# Patient Record
Sex: Male | Born: 1950 | Race: White | Hispanic: No | Smoking: Never smoker
Health system: Southern US, Community
[De-identification: ages and names within clinical notes are randomized; demographics above are authoritative.]

## PROBLEM LIST (undated history)

## (undated) DIAGNOSIS — J45909 Unspecified asthma, uncomplicated: Secondary | ICD-10-CM

## (undated) DIAGNOSIS — M1990 Unspecified osteoarthritis, unspecified site: Secondary | ICD-10-CM

## (undated) DIAGNOSIS — T7840XA Allergy, unspecified, initial encounter: Secondary | ICD-10-CM

## (undated) DIAGNOSIS — R972 Elevated prostate specific antigen [PSA]: Secondary | ICD-10-CM

## (undated) DIAGNOSIS — H269 Unspecified cataract: Secondary | ICD-10-CM

## (undated) HISTORY — DX: Allergy, unspecified, initial encounter: T78.40XA

## (undated) HISTORY — DX: Unspecified cataract: H26.9

## (undated) HISTORY — PX: COLONOSCOPY: SHX174

## (undated) HISTORY — PX: NASAL POLYP EXCISION: SHX2068

## (undated) HISTORY — PX: POLYPECTOMY: SHX149

## (undated) HISTORY — DX: Elevated prostate specific antigen (PSA): R97.20

## (undated) HISTORY — DX: Unspecified asthma, uncomplicated: J45.909

## (undated) HISTORY — DX: Unspecified osteoarthritis, unspecified site: M19.90

---

## 2007-11-19 HISTORY — PX: TOTAL HIP ARTHROPLASTY: SHX124

## 2008-08-29 ENCOUNTER — Inpatient Hospital Stay (HOSPITAL_COMMUNITY): Admission: RE | Admit: 2008-08-29 | Discharge: 2008-09-01 | Payer: Self-pay | Admitting: Orthopedic Surgery

## 2008-08-29 ENCOUNTER — Encounter (INDEPENDENT_AMBULATORY_CARE_PROVIDER_SITE_OTHER): Payer: Self-pay | Admitting: Orthopedic Surgery

## 2009-10-19 IMAGING — CR DG CHEST 2V
2 series · 2 of 2 positions shown · non-contrast
Comparison: None

CLINICAL DATA: Preop evaluation for right hip replacement.

CHEST - 2 VIEW

[w chest pa]
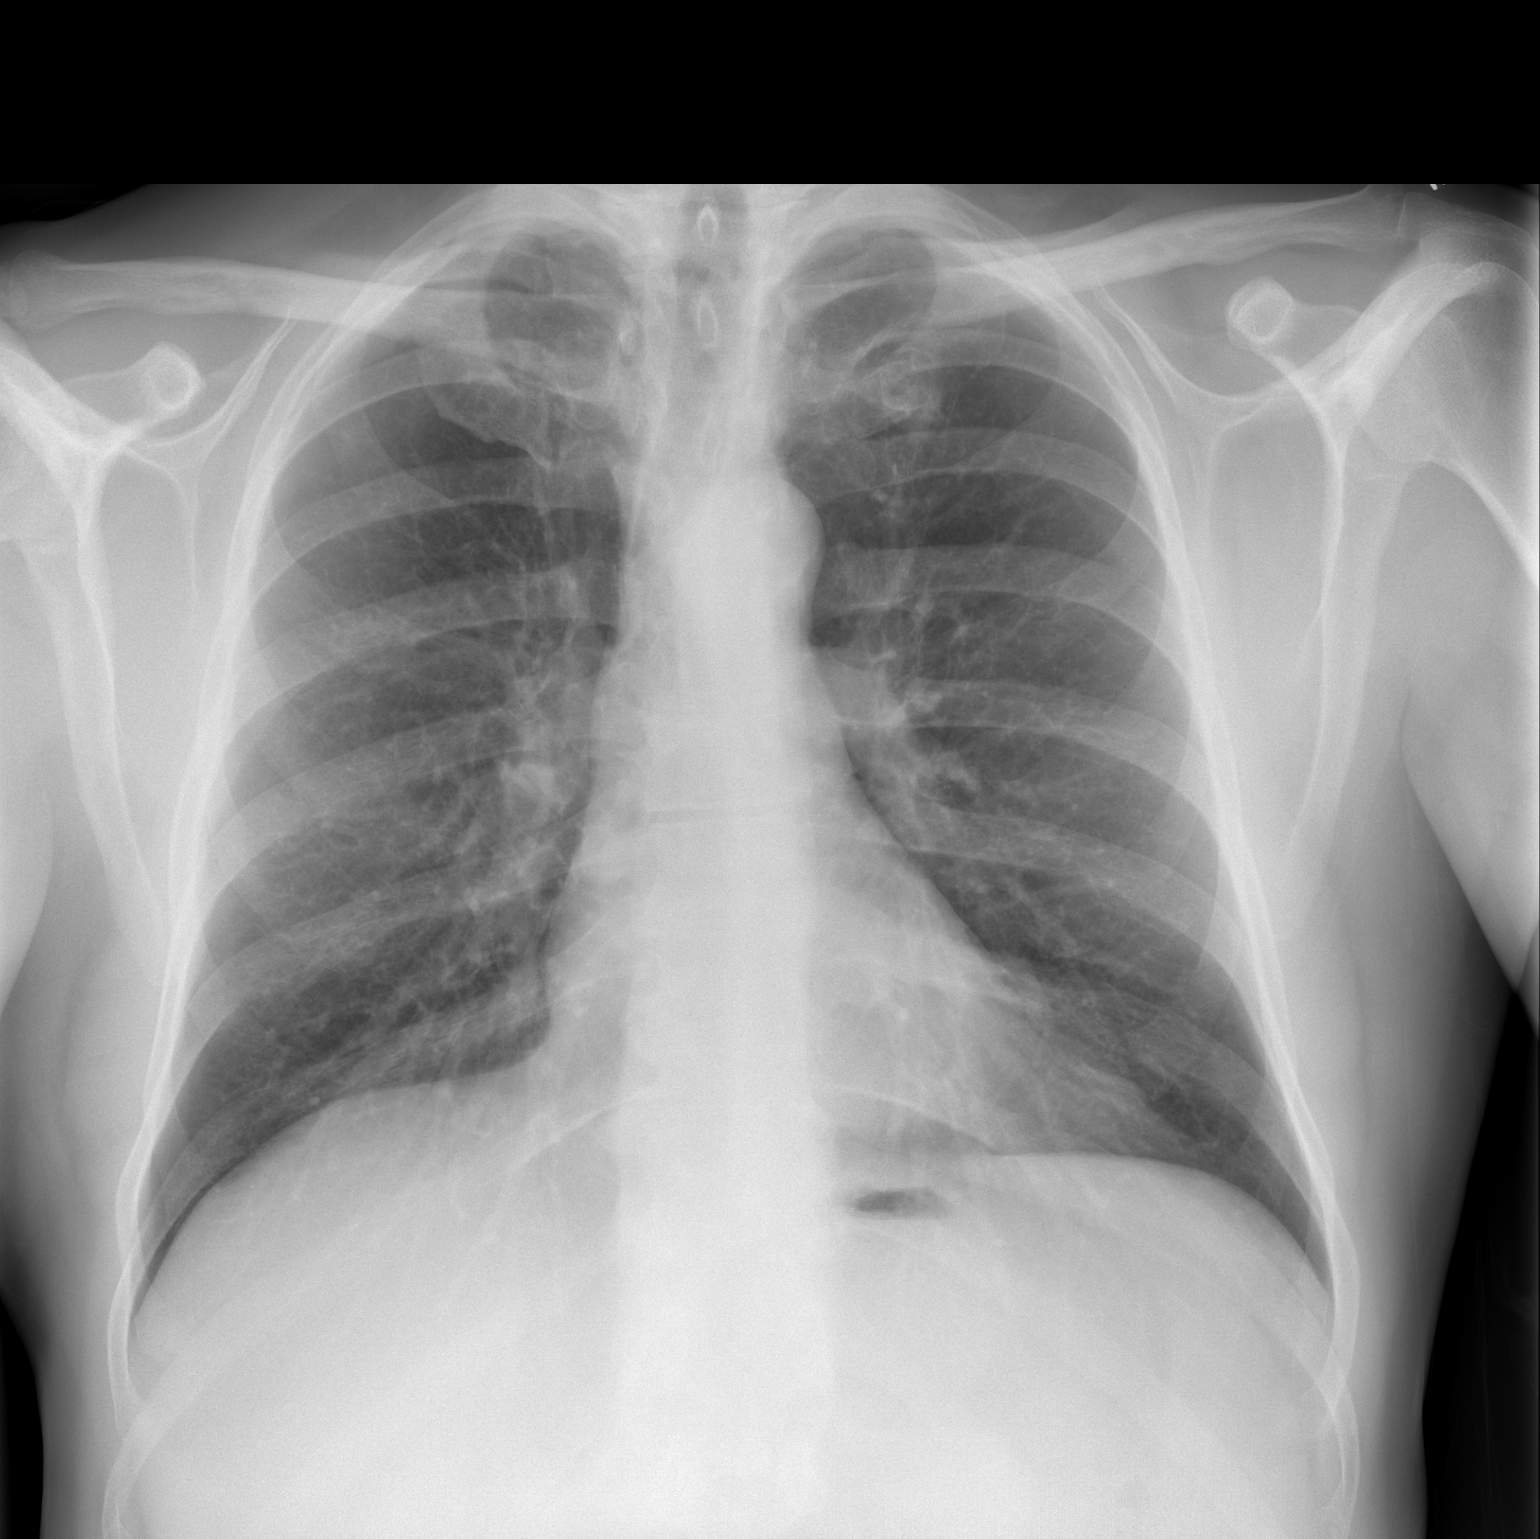

[w chest lat]
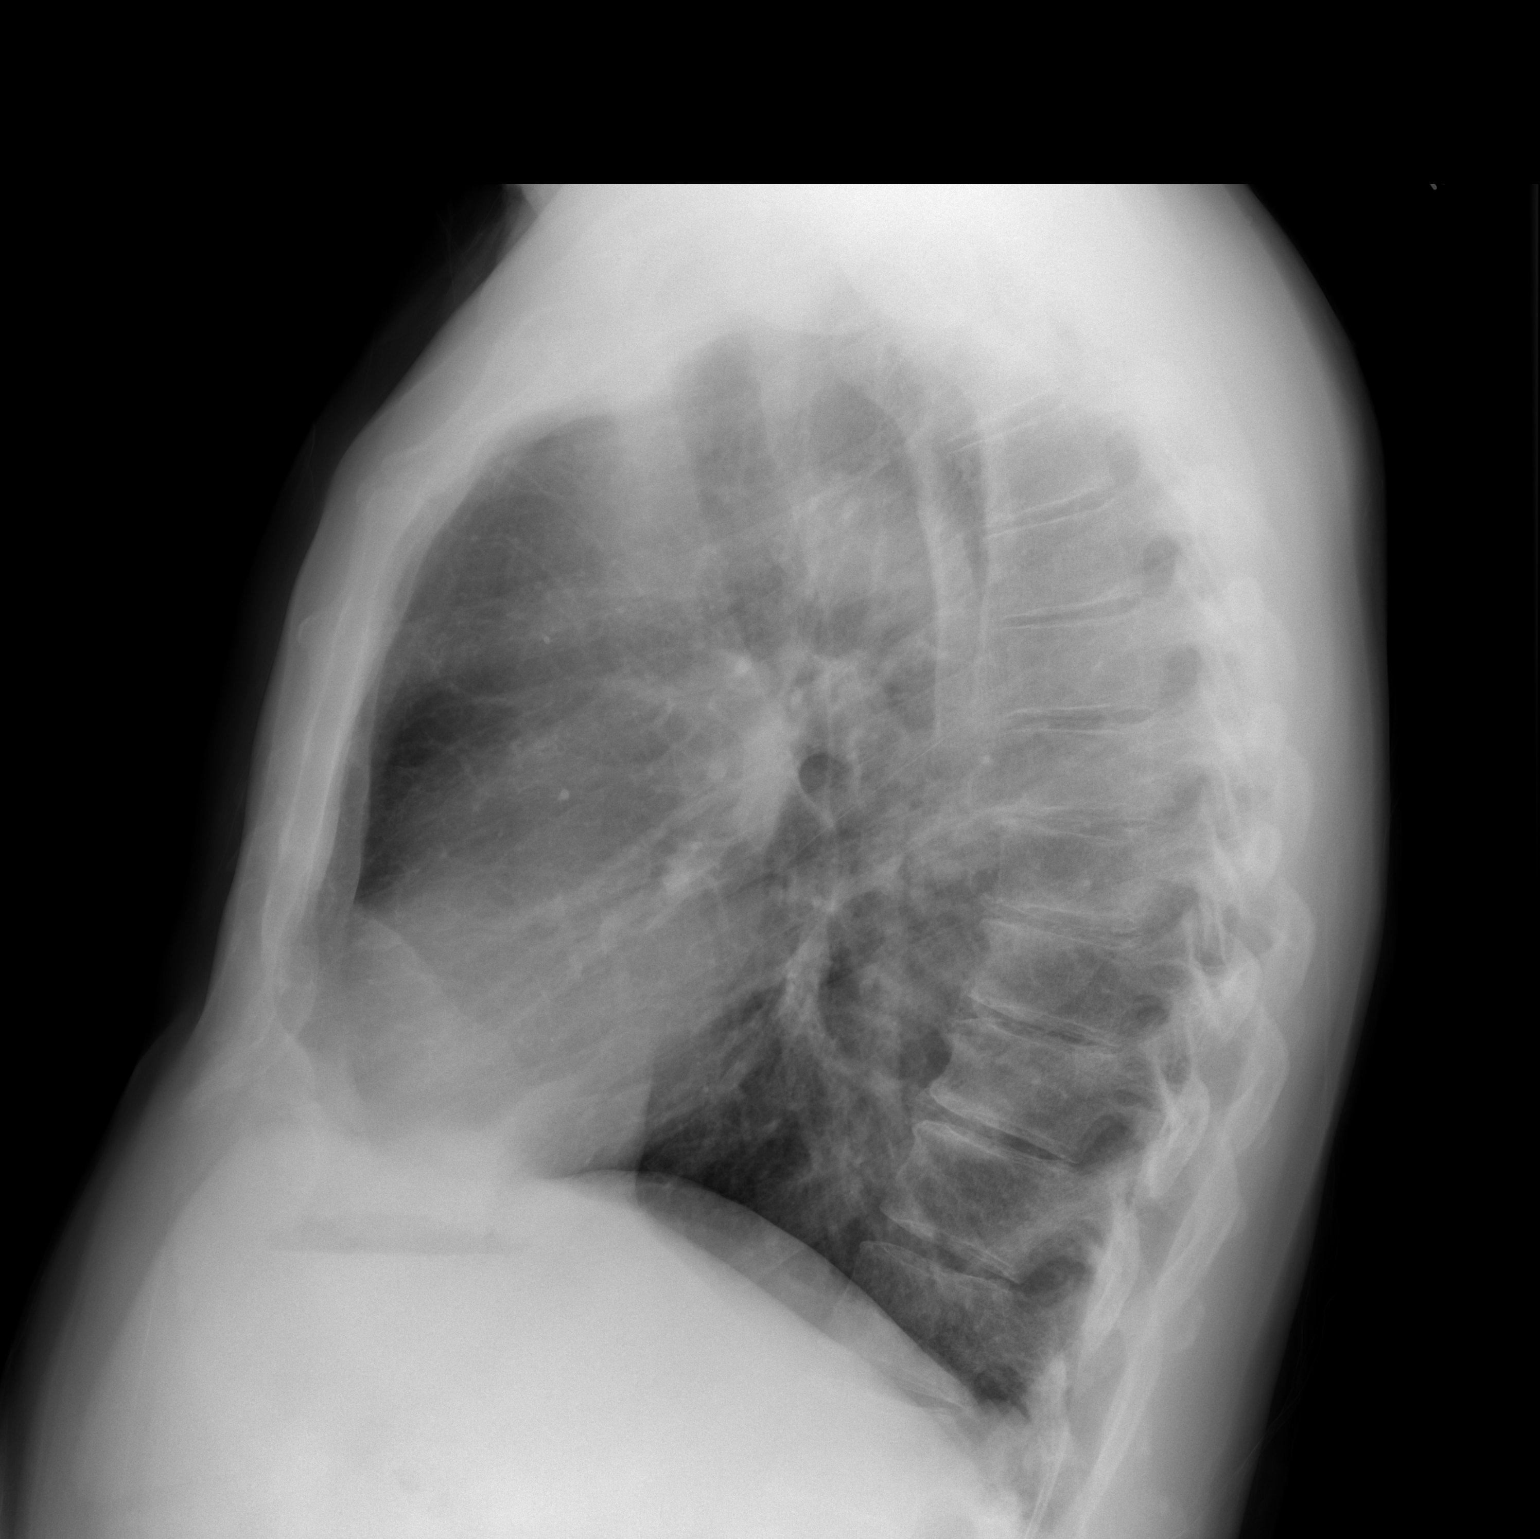

[2 of 2 positions shown; findings below may reference images not displayed]

FINDINGS: Mild hyperexpansion without focal consolidation, edema,
or pleural effusion. The cardiopericardial silhouette is within
normal limits for size. Imaged bony structures of the thorax are
intact.
IMPRESSION: Mild hyperinflation without acute cardiopulmonary findings.

## 2009-10-24 IMAGING — CR DG PORTABLE PELVIS
1 series · 1 of 1 positions shown · non-contrast
Comparison: Portable right hip obtained at the same time.

CLINICAL DATA: Right hip replacement for osteoarthritis.

PORTABLE PELVIS

[view not recorded]
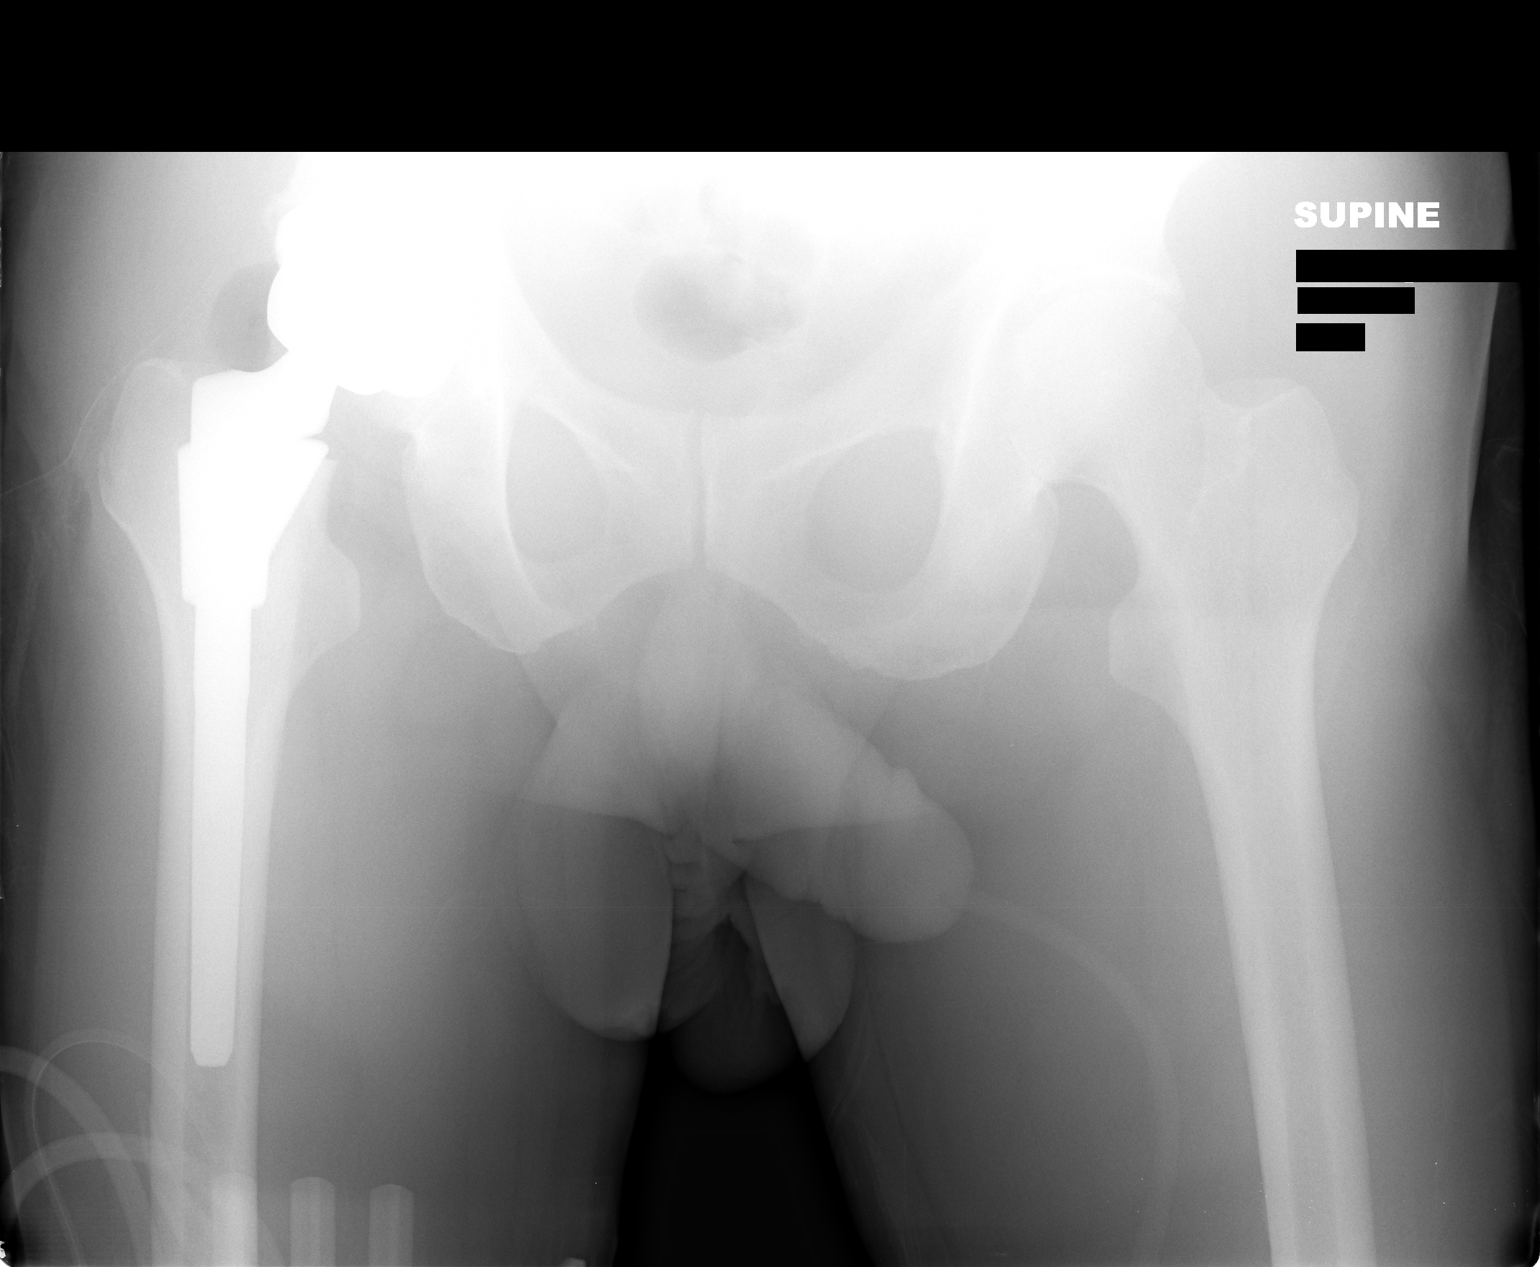

[1 of 1 positions shown; findings below may reference images not displayed]

FINDINGS: Right total hip prosthesis in satisfactory position and
alignment with no fracture or dislocation seen.  Normal appearing
left hip.
IMPRESSION: Satisfactory postoperative appearance of a right total hip
prosthesis.

## 2010-04-30 ENCOUNTER — Emergency Department: Payer: Self-pay | Admitting: Emergency Medicine

## 2010-05-02 ENCOUNTER — Emergency Department (HOSPITAL_COMMUNITY): Admission: EM | Admit: 2010-05-02 | Discharge: 2010-05-02 | Payer: Self-pay | Admitting: Emergency Medicine

## 2010-05-04 ENCOUNTER — Ambulatory Visit (HOSPITAL_COMMUNITY): Admission: RE | Admit: 2010-05-04 | Discharge: 2010-05-04 | Payer: Self-pay | Admitting: Urology

## 2011-02-03 LAB — BASIC METABOLIC PANEL
CO2: 27 mEq/L (ref 19–32)
Calcium: 9.2 mg/dL (ref 8.4–10.5)
Chloride: 104 mEq/L (ref 96–112)
GFR calc Af Amer: >60 mL/min (ref >60)
GFR calc non Af Amer: 51 mL/min — ABNORMAL LOW (ref >60)
Glucose, Bld: 99 mg/dL (ref 70–99)

## 2011-02-03 LAB — CBC
Hemoglobin: 14.9 g/dL (ref 13.0–17.0)
MCHC: 35.2 g/dL (ref 30.0–36.0)
RBC: 4.94 MIL/uL (ref 4.22–5.81)
RDW: 12.5 % (ref 11.5–15.5)
WBC: 11.8 10*3/uL — ABNORMAL HIGH (ref 4.0–10.5)

## 2011-02-03 LAB — DIFFERENTIAL
Basophils Absolute: 0.1 10*3/uL (ref 0.0–0.1)
Basophils Relative: 1 % (ref 0–1)
Eosinophils Absolute: 0.1 10*3/uL (ref 0.0–0.7)
Eosinophils Relative: 1 % (ref 0–5)
Lymphocytes Relative: 6 % — ABNORMAL LOW (ref 12–46)
Monocytes Absolute: 0.9 10*3/uL (ref 0.1–1.0)
Monocytes Relative: 7 % (ref 3–12)
Neutro Abs: 9.9 10*3/uL — ABNORMAL HIGH (ref 1.7–7.7)
Neutrophils Relative %: 85 % — ABNORMAL HIGH (ref 43–77)

## 2011-02-03 LAB — SURGICAL PCR SCREEN: Staphylococcus aureus: POSITIVE — AB

## 2011-04-02 NOTE — Discharge Summary (Signed)
NAME:  LENIN, KUHNLE NO.:  [id number]   MEDICAL RECORD NO.:  [id number]          PATIENT TYPE:  INP   LOCATION:  1620                         FACILITY:  [facility]   PHYSICIAN:  Ollen Gross, M.D.    DATE OF BIRTH:  [birth year]   DATE OF ADMISSION:  08/29/2008  DATE OF DISCHARGE:  09/01/2008                               DISCHARGE SUMMARY   ADMITTING DIAGNOSES:  1. Osteoarthritis right hip.  2. Degenerative disk disease lumbar spine.   DISCHARGE DIAGNOSES:  1. Osteoarthritis right hip status post right total replacement      arthroplasty.  2. Osteoarthritis right hip.  3. Degenerative disk disease lumbar spine.   PROCEDURE:  August 29, 2008 right total hip.  Surgeon Dr. Lequita Halt,  assistant Julien Girt PA-C.  Anesthesia general.   CONSULTS:  None.   BRIEF HISTORY:  Yohannes is a 60 year old male with severe end-stage  arthritis of the right hip, progressive worsening pain and dysfunction,  failed outpatient management now presents for total hip arthroplasty.   LABORATORY DATA:  Preop CBC showed hemoglobin of 16.2, hematocrit of  46.2, white cell count 6.7, platelets 184.  Chem panel slightly elevated  total bili of 1.3.  Remaining Chem panel all within normal limits.  PT/INR 13.1 and 1 with a PTT of 31.  Preop UA negative.  Serial CBCs  were followed throughout the hospital course.  Hemoglobin did drop down  to 11.9.  Last noted H&H was 11.7 and 33.  Serial protimes followed.  PT/INR 20 and 1.6.  Serial B-mets were followed.  Electrolytes remained  within normal limits  Pathology report:  Femoral head cyst evaluation, the articular cartilage  shows degenerative changes and there is a subchondral fibrosis with  associated cyst formation.  The findings are consistent with  degenerative arthritis.  Chest x-ray: August 24, 2008:  Mild hyperinflation without acute  cardiopulmonary findings.  Right hip films August 24, 2008:  Osteoarthritic changes right hip.  EKG  August 24, 2008:  Normal sinus rhythm, normal EKG, no old trace to  compare, confirmed by Dr. Nicki Guadalajara.   HOSPITAL COURSE:  The patient was admitted to [facility].  Tolerated procedure  well, later transferred to recovery and  the  orthopedic floor, started on PCA and p.o. analgesic pain control  following surgery.  Actually did pretty well on the evening of surgery,  on the morning of day 1, did sit up and dangle his feet on the evening  of surgery, starting to get up, short distance on day 1.  Hemovac drain  placed during surgery was pulled on day 1, given 24 hours postop IV  antibiotics.  Started on PCA, which was discontinued on the next day.  On postop day #1, had excellent urinary output.  He got up and walked  200 feet on the afternoon postop day #1, doing extremely well.  By day  2, dessing changed, incision looked good.  He was tolerating p.o. meds,  walking nearly 300 feet.  Continued to progress well and  was ready go  home by postop day #3.  DISCHARGE PLAN:  The patient discharged home on September 01, 2008.   DISCHARGE DIAGNOSES:  Please see above.   DISCHARGE MEDS:  1. Coumadin.  2. Percocet.  3. Robaxin.   DIET:  Regular diet.   ACTIVITIES:  Partial weightbearing 25-50% to the right lower extremity.  Home health PT and home health nursing.  Total hip protocol.  Hip  precautions.  May start showering but do not submerge incision under  water.  Follow-up 2 weeks.   DISPOSITION:  Home.   CONDITION ON DISCHARGE:  Improving.      Alexzandrew L. Perkins, P.A.C.      Ollen Gross, M.D.  Electronically Signed    ALP/MEDQ  D:  09/01/2008  T:  09/01/2008  Job:  [id number]   cc:   Ollen Gross, M.D.  Fax: [phone]

## 2011-04-02 NOTE — Op Note (Signed)
NAME:  Tom Mcknight, Tom Mcknight NO.:  [id number]   MEDICAL RECORD NO.:  [id number]          PATIENT TYPE:  INP   LOCATION:  0007                         FACILITY:  [facility]   PHYSICIAN:  Ollen Gross, M.D.    DATE OF BIRTH:  1951/09/17   DATE OF PROCEDURE:  08/29/2008  DATE OF DISCHARGE:                               OPERATIVE REPORT   PREOPERATIVE DIAGNOSIS:  Osteoarthritis right hip.   POSTOPERATIVE DIAGNOSIS:  Osteoarthritis right hip.   PROCEDURE:  Right total hip arthroplasty.   SURGEON:  Ollen Gross, M.D.   ASSISTANT:  Avel Peace PA-C   ANESTHESIA:  General.   ESTIMATED BLOOD LOSS:  700   DRAIN:  Hemovac times one.   COMPLICATIONS:  None.   CONDITION.:  Stable to the recovery room.   BRIEF OPERATIVE NOTE:  Tom Mcknight is a 60 year old male with end-stage  arthritis of the right hip with progressively worsening pain and  dysfunction.  He has failed nonoperative management abdomen presents for  total hip arthroplasty.   PROCEDURE IN DETAIL:  After the successful administration of general  anesthetic, the patient was placed in left lateral decubitus position  with the right side up and held with the hip positioner.  Right lower  extremity isolated from his perineum with plastic drapes and prepped and  draped in the usual sterile fashion.  Short posterolateral incision was  made with a 10 blade through subcutaneous tissue to the level of the  fascia lata which was incised in line with skin incision.  The sciatic  nerve was palpated and protected and short external rotators isolated  off the femur.  Capsulectomy is performed and the hip was dislocated.  The center of femoral head is marked and a trial prosthesis placed such  that the center of the trial head corresponds to center of his native  femoral head.  Osteotomy lines marked on the femoral neck and osteotomy  made with an oscillating saw.  The femoral head removed and the femur  was retracted anteriorly  to gain acetabular exposure.   Acetabular retractors were placed and labrum and osteophytes removed.  Reaming starts at 47 mm coursing increments of 2-55 mm and a 56-mm  pinnacle acetabular shell was placed in anatomic position and transfixed  with two dome screws.  The apex hole eliminator is placed and then the  permanent 40 mm neutral Ultramet liner is placed for metal-on-metal hip  replacement.   The femur was repaired canal finder irrigation.  Axial reaming is  performed 13.5 mm proximal reaming to 18.5 large sleeve machined to a  large.  After large trial sleeve is placed with 18 x 13 stem and 36 plus  8 neck 10 degrees beyond native anteversion.  The 40 plus 0 head is  placed and the hip is reduced with outstanding stability.  There is full  extension, full external rotation, 70 degrees of flexion, 40 degrees of  adduction and 90 degrees of internal rotation and 90 degrees of flexion  and 70 degrees of internal rotation.  By placing the right leg on top of  the  left it felt as though leg lengths were equal.  The hip was then  dislocated and all trials were removed.  The permanent 18.5 large sleeve  is placed and 18 x 13 stem with 36 plus 8 neck 10 degrees beyond native  anteversion.  A 40 plus 0 head is placed and the hip reduced with same  stability parameters.  The wound was copiously irrigated with saline  solution and short rotators reattached to the femur through drill holes.  Fascia lata closed over Hemovac drain with interrupted #1 Vicryl, subcu  closed with #1-0 and #2-0 Vicryl, and subcuticular running 4-0 Monocryl.  Incisions cleaned and dried and Steri-Strips and a bulky sterile  dressing applied.  The drain was hooked to suction and he was placed  into a knee immobilizer, awakened and transferred to recovery in stable  condition.      Ollen Gross, M.D.  Electronically Signed     FA/MEDQ  D:  08/29/2008  T:  08/29/2008  Job:  [id number]

## 2011-04-05 NOTE — H&P (Signed)
NAME:  Tom Mcknight, Tom Mcknight NO.:  [id number]   MEDICAL RECORD NO.:  [id number]          PATIENT TYPE:  INP   LOCATION:                               FACILITY:  [facility]   PHYSICIAN:  Ollen Gross, M.D.    DATE OF BIRTH:  1951-07-04   DATE OF ADMISSION:  08/29/2008  DATE OF DISCHARGE:                              HISTORY & PHYSICAL   CHIEF COMPLAINT:  Right hip pain.   HISTORY OF PRESENT ILLNESS:  The patient is a 60 year old gentleman with  right hip pain.  He has been evaluated and treated for arthritis of the  right hip since 2003.  He has had progressive worsening of right hip  pain.  He has noted loss of range of motion.  He has failed continued  conservative treatment.  The patient has elected to proceed with a right  total hip arthroplasty.   ALLERGIES:  SULFA MEDICATIONS.   CURRENT MEDICATIONS:  1. Aspirin 81 mg a day.  2. _____________200 mg once a day.   PRIMARY CARE PHYSICIAN:  Dr. Alonna Buckler.   NEUROLOGIST:  Dr. Trey Sailors.   UROLOGIST:  Dr. Gaynelle Arabian.   PRESENT MEDICAL HISTORY:  Degenerative disc disease of the lumbar spine.   REVIEW OF SYSTEMS:  Is negative for any NEUROLOGICAL issues, PULMONARY  issues.  He outgrew asthma, last attack was at the age of 39.  He denies  any CARDIOVASCULAR issues.  No GI or GU issues.  No ENDOCRINE or  HEMATOLOGIC issues.   PAST SURGICAL HISTORY:  Positive for nasal polyps in 1996 and 1998  without any complications.   FAMILY MEDICAL HISTORY:  Father is deceased at age 22, complications  related to brain tumor surgery.  Mother is age 18 with dementia.   SOCIAL HISTORY:  The patient is married.  He is a Programmer, multimedia.  He has never smoked.  No alcohol or drugs.  He has two grown  children.  Lives in the home with one story.   PHYSICAL EXAMINATION:  VITAL SIGNS:  Height is 6 feet, 1 inch.  Weight  is 200 pounds.  Blood pressure is 134/88, pulse is 70 and regular,  respirations 12.  The patient is  afebrile.  GENERAL:  This is a healthy-appearing well-developed, physically fit-  appearing gentleman who is conscious, alert and appropriate.  He appears  to be a good historian.  He walks with a fairly easy balanced gait.  HEENT:  Head was normocephalic.  Pupils equal, round, reactive.  Gross  hearing is intact.  NECK:  Supple.  No palpable lymphadenopathy.  Good range of motion.  CHEST:  Lung sounds are clear and equal bilaterally.  No wheezes, rales  or rhonchi.  HEART:  Regular rate and rhythm with no murmurs, rubs or gallops.  ABDOMEN:  Positive bowel sounds, soft, nontender.  EXTREMITIES:  Upper extremities have good range of motion of shoulders,  elbows and wrists.  Motor strength is 5/5.  Lower extremities:  Right  hip he had full extension, flexion up to 120 degrees.  He had 0 degrees  of internal rotation and about 15 degrees of external rotation.  Left  hip had full extension, flexion up to 130.  He had about 30 degrees of  internal and external rotation without any discomfort.  Both knees were  symmetrical with full range of motion.  Ankles were normal with good  motion.  PERIPHERAL VASCULAR:  Carotid pulses were 2+ with no bruits.  Radial  pulses are 2+.  Dorsalis pedis pulses and posterior tibial pulses were  2+.  He had no lower extremity edema, venous stasis changes or  pigmentation changes.  NEUROLOGICAL:  Patient is conscious, alert and appropriate.  No gross  neurological defects.  BREASTS/RECTAL/GU EXAM:  Deferred at this time.   IMPRESSION:  1. End-stage osteoarthritis of right hip.  2. Degenerative disc disease,  lumbar spine.   DISPOSITION:  At this time, Tom Mcknight will undergo all routine  laboratories and tests prior to having a right total hip arthroplasty by  Dr. Lequita Halt at [facility] on August 29, 2008.      Jamelle Rushing, P.A.      Ollen Gross, M.D.  Electronically Signed    RWK/MEDQ  D:  08/11/2008  T:  08/11/2008  Job:   [id number]

## 2011-06-25 IMAGING — CT CT STONE STUDY
1 of 2 series · 15 of 32 positions shown, 19 images · non-contrast
Comparison: none

REASON FOR EXAM: right flank/RLQ pain, nausea
COMMENTS:

PROCEDURE:     CT  - CT ABDOMEN /PELVIS WO (STONE)  - April 30, 2010  [DATE]
RESULT:
HISTORY: Right flank pain.

[Series 2: stone · axial · 0.76mm/px · z∈[-602,-156]mm · 15 of 169 slices shown, 19 images]
[im 13/169  soft-tissue]
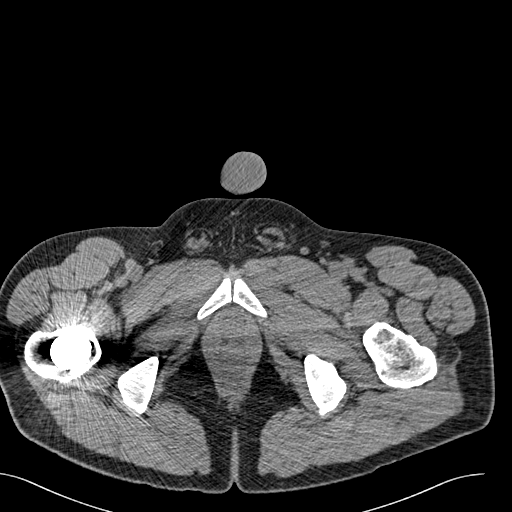
[im 13/169  bone]
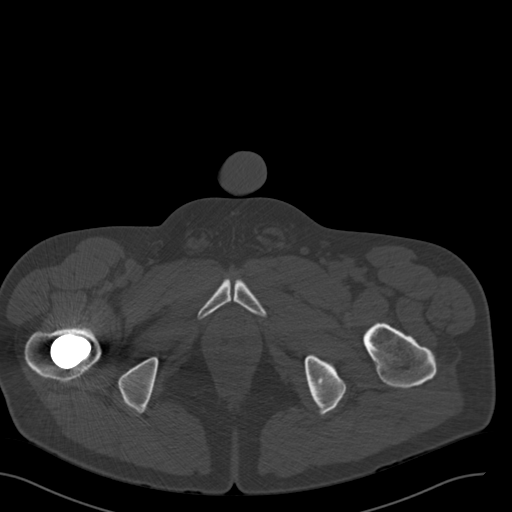
[im 25/169  soft-tissue]
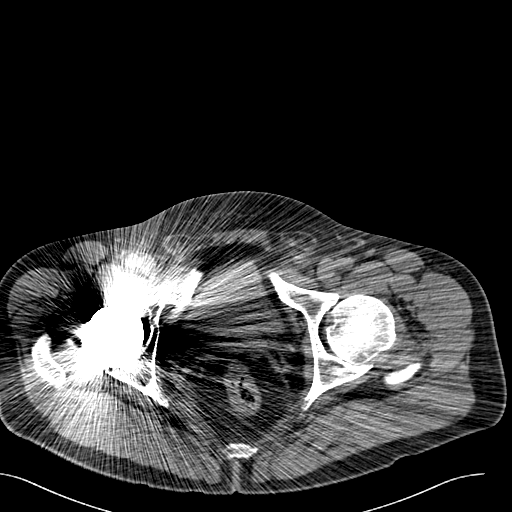
[im 38/169  soft-tissue]
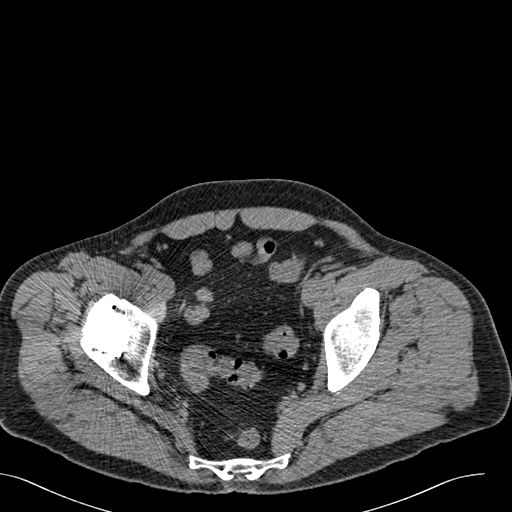
[im 50/169  soft-tissue]
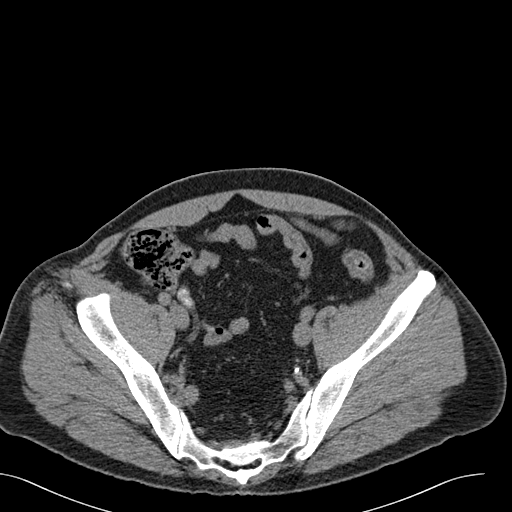
[im 63/169  soft-tissue]
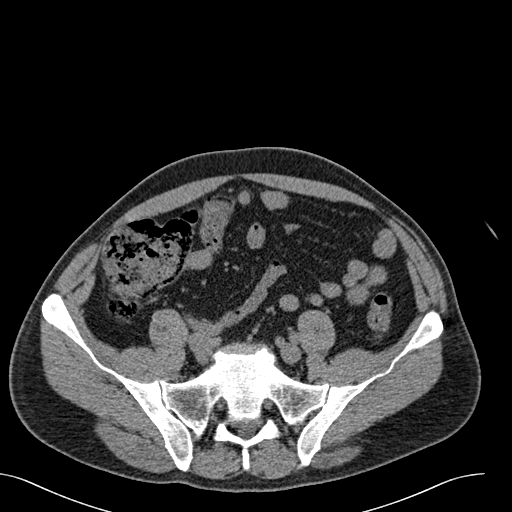
[im 75/169  soft-tissue]
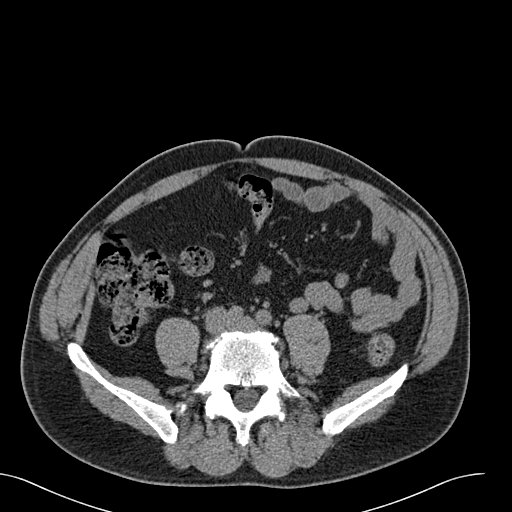
[im 88/169  soft-tissue]
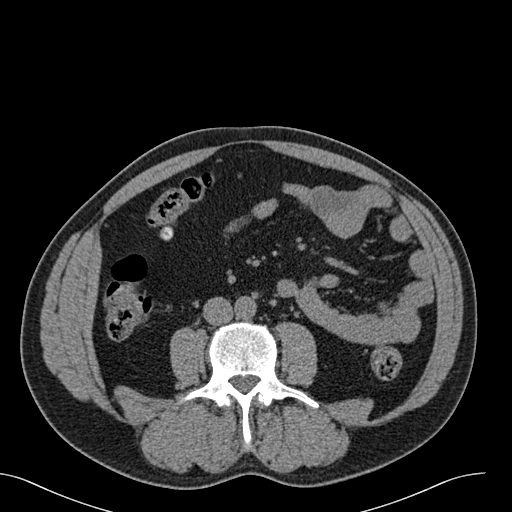
[im 100/169  soft-tissue]
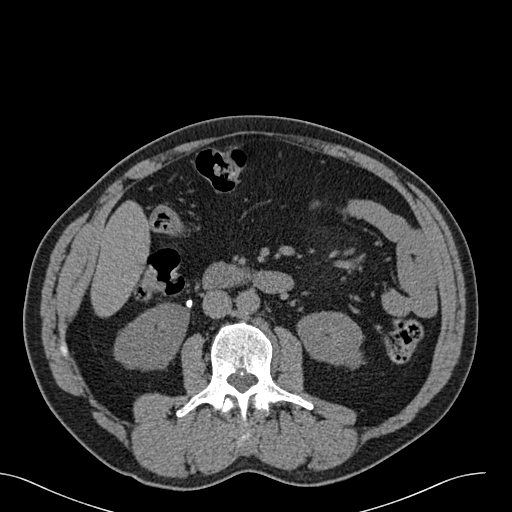
[im 113/169  soft-tissue]
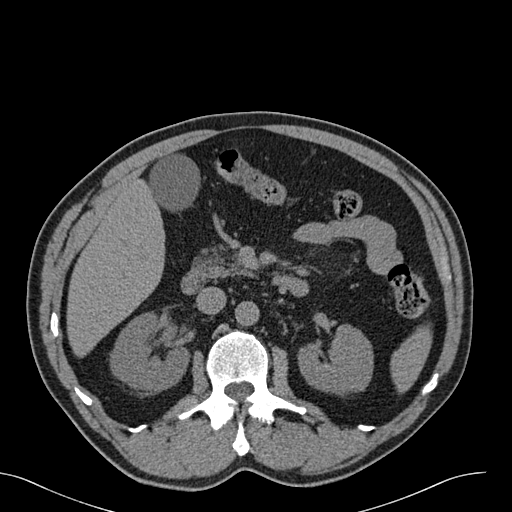
[im 113/169  bone]
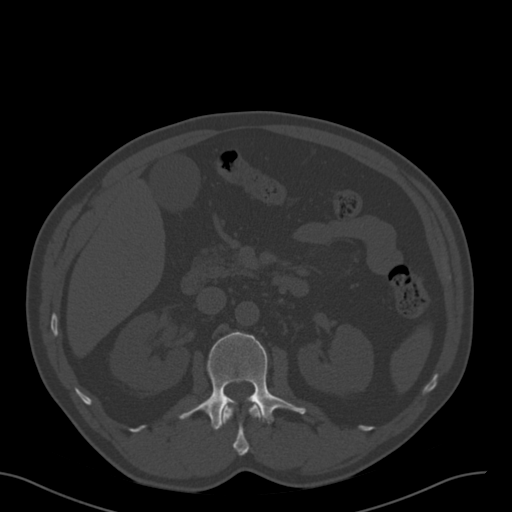
[im 125/169  soft-tissue]
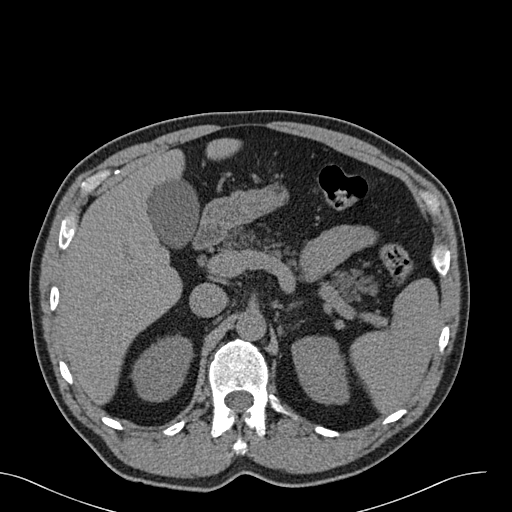
[im 137/169  soft-tissue]
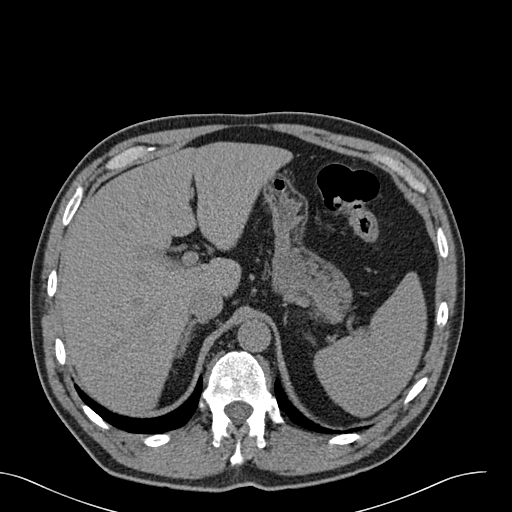
[im 144/169  lung]
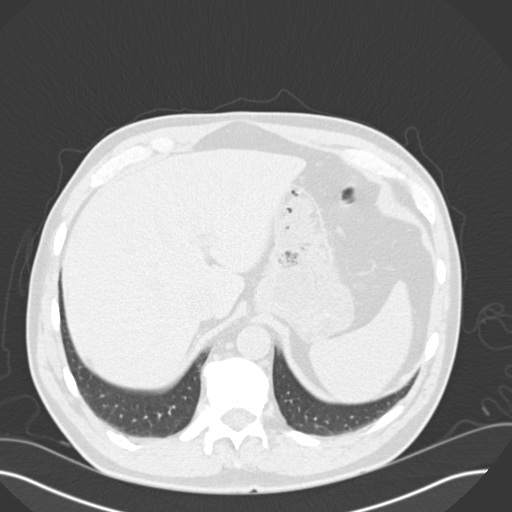
[im 150/169  soft-tissue]
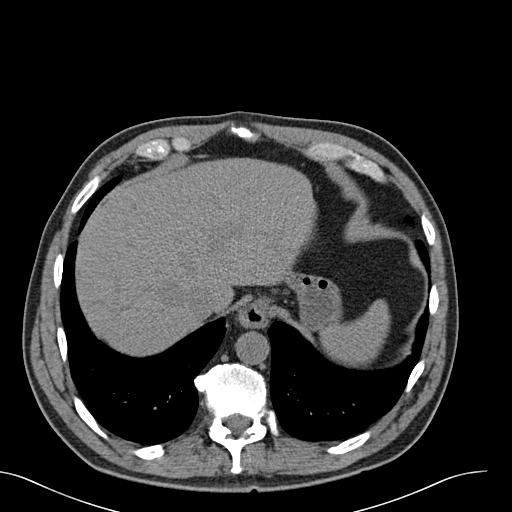
[im 150/169  lung]
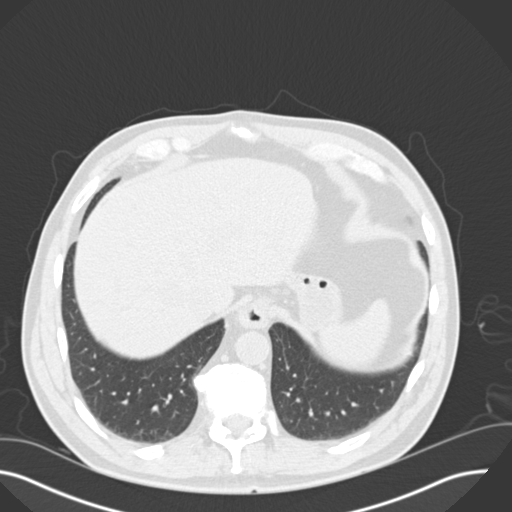
[im 156/169  lung]
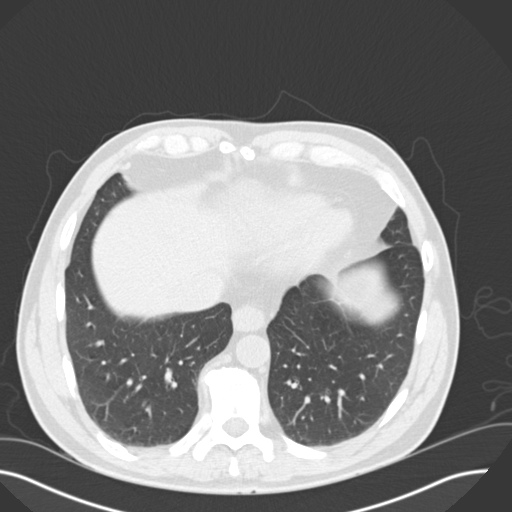
[im 162/169  soft-tissue]
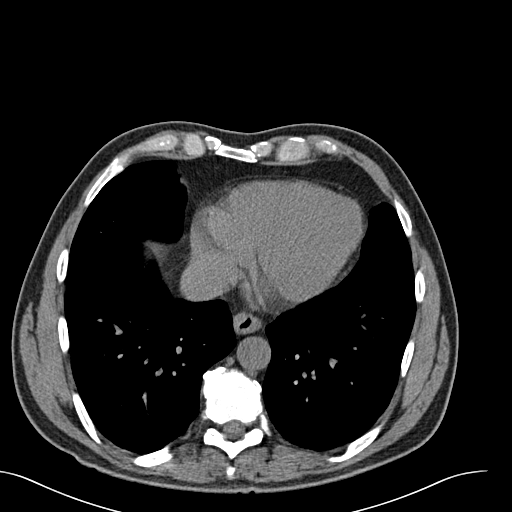
[im 162/169  lung]
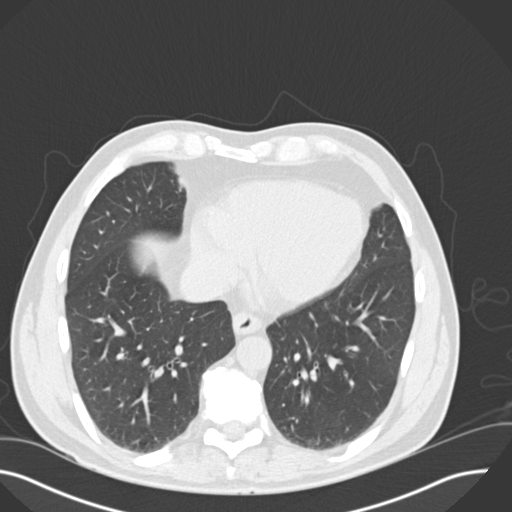

[15 of 32 positions shown; findings below may reference images not displayed]

COMPARISON STUDY: No prior.

PROCEDURE AND FINDINGS: Standard nonenhanced CT of the abdomen and pelvis is
obtained. Esophageal wall thickening is noted. Esophagitis and esophageal
malignancy can both present in this fashion. No focal significant hepatic or
splenic abnormality noted. The spleen is unremarkable. The adrenals are
unremarkable. There is an obstructing 6 mm proximal right ureteral stone
with mild hydronephrosis. The retroperitoneum is unremarkable. Aortoiliac
atherosclerotic vascular disease is present. No evidence of retroperitoneal
adenopathy or significant inguinal adenopathy. Left scrotal hydrocele is
noted. No pathologic pelvic fluid collections are noted. Streak artifact is
noted from right hip replacement. There is no bowel distention.
Appendicolith noted in the appendix without evidence of appendical swelling
or periappendiceal soft tissues changes.
IMPRESSION: 1.     6 mm stone obstructing the proximal right ureter with mild
hydronephrosis.
2.     Esophageal wall thickening, esophagitis could present in this
fashion. Esophageal malignancy cannot be excluded.
3.     Small left scrotal hydrocele.
4.     Appendicolith, no evidence of appendiceal swelling or periappendiceal
swelling to suggest appendicitis.
5.     Incidental note is made of sigmoid colonic diverticulosis, no
evidence of diverticulitis.

## 2011-07-29 ENCOUNTER — Other Ambulatory Visit: Payer: Self-pay | Admitting: Gastroenterology

## 2011-08-20 LAB — BASIC METABOLIC PANEL
BUN: 9
Calcium: 8.5
Chloride: 105
Creatinine, Ser: 0.99
GFR calc Af Amer: >60
GFR calc Af Amer: >60
GFR calc non Af Amer: >60
Sodium: 140

## 2011-08-20 LAB — PROTIME-INR
INR: 1
INR: 1.1
Prothrombin Time: 13.1

## 2011-08-20 LAB — CBC
HCT: 33 — ABNORMAL LOW
HCT: 33.9 — ABNORMAL LOW
Hemoglobin: 11.7 — ABNORMAL LOW
Hemoglobin: 11.9 — ABNORMAL LOW
MCHC: 35.3
MCHC: 35.5
MCV: 88.2
MCV: 88.5
Platelets: 164
Platelets: 184
RBC: 3.75 — ABNORMAL LOW
RBC: 3.84 — ABNORMAL LOW
RBC: 4.23
RBC: 5.31
RDW: 13.4
RDW: 13.9
WBC: 7.1

## 2011-08-20 LAB — COMPREHENSIVE METABOLIC PANEL
ALT: 25
Calcium: 9.4
GFR calc Af Amer: >60
Sodium: 140
Total Bilirubin: 1.3 — ABNORMAL HIGH

## 2011-08-20 LAB — URINALYSIS, ROUTINE W REFLEX MICROSCOPIC
Bilirubin Urine: NEGATIVE
Hgb urine dipstick: NEGATIVE
Protein, ur: NEGATIVE
Specific Gravity, Urine: 1.017
pH: 7

## 2011-08-20 LAB — ABO/RH: ABO/RH(D): A POS

## 2011-08-20 LAB — TYPE AND SCREEN: Antibody Screen: NEGATIVE

## 2014-08-17 DIAGNOSIS — N200 Calculus of kidney: Secondary | ICD-10-CM | POA: Insufficient documentation

## 2016-11-25 DIAGNOSIS — N400 Enlarged prostate without lower urinary tract symptoms: Secondary | ICD-10-CM | POA: Insufficient documentation

## 2016-11-25 DIAGNOSIS — N432 Other hydrocele: Secondary | ICD-10-CM | POA: Insufficient documentation

## 2016-11-25 DIAGNOSIS — R972 Elevated prostate specific antigen [PSA]: Secondary | ICD-10-CM | POA: Insufficient documentation

## 2016-12-30 ENCOUNTER — Telehealth: Payer: Self-pay

## 2016-12-30 NOTE — Telephone Encounter (Signed)
Rec'd from [facility] forward 13 page to GI Historical Provider

## 2017-01-01 ENCOUNTER — Telehealth: Payer: Self-pay | Admitting: Internal Medicine

## 2017-01-01 NOTE — Telephone Encounter (Signed)
Received records from [facility] and placed on Dr. Blanch Media desk for review.  Patient is requesting to switch back to Dr. Henrene Pastor because of insurance purposes and his wife is a patient of Dr. Blanch Media

## 2017-01-06 NOTE — Telephone Encounter (Signed)
Dr.Perry reviewed records and has accepted patient to schedule direct colon. Spoke with patient and he states he would call back when May schedule opens to schedule morning colon. Records placed in review box.

## 2017-01-20 ENCOUNTER — Encounter: Payer: Self-pay | Admitting: Internal Medicine

## 2017-03-17 ENCOUNTER — Ambulatory Visit (AMBULATORY_SURGERY_CENTER): Payer: Self-pay | Admitting: *Deleted

## 2017-03-17 VITALS — Ht 73.0 in | Wt 210.0 lb

## 2017-03-17 DIAGNOSIS — M545 Low back pain, unspecified: Secondary | ICD-10-CM | POA: Insufficient documentation

## 2017-03-17 DIAGNOSIS — Z86010 Personal history of colonic polyps: Secondary | ICD-10-CM

## 2017-03-17 DIAGNOSIS — E785 Hyperlipidemia, unspecified: Secondary | ICD-10-CM | POA: Insufficient documentation

## 2017-03-17 MED ORDER — NA SULFATE-K SULFATE-MG SULF 17.5-3.13-1.6 GM/177ML PO SOLN: ORAL | 0 refills | Status: DC

## 2017-03-17 NOTE — Progress Notes (Signed)
Patient denies any allergies to eggs or soy. Patient denies any problems with anesthesia/sedation. Patient denies any oxygen use at home and does not take any diet/weight loss medications. EMMI education assisgned to patient on colonoscopy, this was explained and instructions given to patient. 

## 2017-04-01 ENCOUNTER — Ambulatory Visit (AMBULATORY_SURGERY_CENTER): Payer: Medicare Other | Admitting: Internal Medicine

## 2017-04-01 ENCOUNTER — Encounter: Payer: Self-pay | Admitting: Internal Medicine

## 2017-04-01 VITALS — BP 116/79 | HR 70 | Temp 98.0°F | Resp 15 | Ht 73.0 in | Wt 210.0 lb

## 2017-04-01 DIAGNOSIS — D123 Benign neoplasm of transverse colon: Secondary | ICD-10-CM

## 2017-04-01 DIAGNOSIS — Z86010 Personal history of colonic polyps: Secondary | ICD-10-CM | POA: Diagnosis present

## 2017-04-01 MED ORDER — SODIUM CHLORIDE 0.9 % IV SOLN: 500.0000 mL | INTRAVENOUS | Status: DC

## 2017-04-01 NOTE — Progress Notes (Signed)
To recovery, report to [place], RN, VSS 

## 2017-04-01 NOTE — Progress Notes (Signed)
Called to room to assist during endoscopic procedure.  Patient ID and intended procedure confirmed with present staff. Received instructions for my participation in the procedure from the performing physician.  

## 2017-04-01 NOTE — Op Note (Signed)
[facility] Patient Name: Tom Mcknight Procedure Date: 04/01/2017 8:38 AM MRN: [id number] Endoscopist: Docia Chuck. Henrene Pastor , MD Age: 66 Referring MD:  Date of Birth: 12-14-1950 Gender: Male Account #: [id number] Procedure:                Colonoscopy, with cold snare polypectomy x 1 Indications:              High risk colon cancer surveillance: Personal                            history of adenoma (10 mm or greater in size), High                            risk colon cancer surveillance: Personal history of                            multiple (3 or more) adenomas. Index examinations                            here 2003 and 2004 with multiple and advanced                            adenomatous. Subsequent examination with Dr.                            Cristina Gong 2012 with tubular adenomas Medicines:                Monitored Anesthesia Care Procedure:                Pre-Anesthesia Assessment:                           - Prior to the procedure, a History and Physical                            was performed, and patient medications and                            allergies were reviewed. The patient's tolerance of                            previous anesthesia was also reviewed. The risks                            and benefits of the procedure and the sedation                            options and risks were discussed with the patient.                            All questions were answered, and informed consent                            was obtained. Prior Anticoagulants: The patient has  taken no previous anticoagulant or antiplatelet                            agents. ASA Grade Assessment: II - A patient with                            mild systemic disease. After reviewing the risks                            and benefits, the patient was deemed in                            satisfactory condition to undergo the procedure.                           After  obtaining informed consent, the colonoscope                            was passed under direct vision. Throughout the                            procedure, the patient's blood pressure, pulse, and                            oxygen saturations were monitored continuously. The                            Colonoscope was introduced through the anus and                            advanced to the the cecum, identified by                            appendiceal orifice and ileocecal valve. The                            ileocecal valve, appendiceal orifice, and rectum                            were photographed. The quality of the bowel                            preparation was excellent. The colonoscopy was                            performed without difficulty. The patient tolerated                            the procedure well. The bowel preparation used was                            SUPREP. Scope In: 8:46:09 AM Scope Out: 9:01:27 AM Scope Withdrawal Time: 0 hours 11 minutes 17 seconds  Total Procedure Duration: 0 hours 15 minutes 18  seconds  Findings:                 A 3 mm polyp was found in the proximal transverse                            colon. The polyp was removed with a cold snare.                            Resection and retrieval were complete.                           Multiple small and large-mouthed diverticula were                            found in the entire colon.                           Internal hemorrhoids were found during                            retroflexion. The hemorrhoids were small.                           The exam was otherwise without abnormality on                            direct and retroflexion views. Complications:            No immediate complications. Estimated blood loss:                            None. Estimated Blood Loss:     Estimated blood loss: none. Impression:               - One 3 mm polyp in the proximal transverse colon,                             removed with a cold snare. Resected and retrieved.                           - Diverticulosis in the entire examined colon.                           - Internal hemorrhoids.                           - The examination was otherwise normal on direct                            and retroflexion views. Recommendation:           - Repeat colonoscopy in 5 years for surveillance.                           - Patient has a contact number available for  emergencies. The signs and symptoms of potential                            delayed complications were discussed with the                            patient. Return to normal activities tomorrow.                            Written discharge instructions were provided to the                            patient.                           - Resume previous diet.                           - Continue present medications.                           - Await pathology results. Docia Chuck. Henrene Pastor, MD 04/01/2017 9:05:51 AM This report has been signed electronically.

## 2017-04-01 NOTE — Patient Instructions (Signed)
YOU HAD AN ENDOSCOPIC PROCEDURE TODAY AT THE [facility]:   Refer to the procedure report that was given to you for any specific questions about what was found during the examination.  If the procedure report does not answer your questions, please call your gastroenterologist to clarify.  If you requested that your care partner not be given the details of your procedure findings, then the procedure report has been included in a sealed envelope for you to review at your convenience later.  YOU SHOULD EXPECT: Some feelings of bloating in the abdomen. Passage of more gas than usual.  Walking can help get rid of the air that was put into your GI tract during the procedure and reduce the bloating. If you had a lower endoscopy (such as a colonoscopy or flexible sigmoidoscopy) you may notice spotting of blood in your stool or on the toilet paper. If you underwent a bowel prep for your procedure, you may not have a normal bowel movement for a few days.  Please Note:  You might notice some irritation and congestion in your nose or some drainage.  This is from the oxygen used during your procedure.  There is no need for concern and it should clear up in a day or so.  SYMPTOMS TO REPORT IMMEDIATELY:   Following lower endoscopy (colonoscopy or flexible sigmoidoscopy):  Excessive amounts of blood in the stool  Significant tenderness or worsening of abdominal pains  Swelling of the abdomen that is new, acute  Fever of 100F or higher   For urgent or emergent issues, a gastroenterologist can be reached at any hour by calling [phone].   DIET:  We do recommend a small meal at first, but then you may proceed to your regular diet.  Drink plenty of fluids but you should avoid alcoholic beverages for 24 hours. Try to increase the fiber in your diet, and drink plenty of water.  ACTIVITY:  You should plan to take it easy for the rest of today and you should NOT DRIVE or use heavy machinery until  tomorrow (because of the sedation medicines used during the test).    FOLLOW UP: Our staff will call the number listed on your records the next business day following your procedure to check on you and address any questions or concerns that you may have regarding the information given to you following your procedure. If we do not reach you, we will leave a message.  However, if you are feeling well and you are not experiencing any problems, there is no need to return our call.  We will assume that you have returned to your regular daily activities without incident.  If any biopsies were taken you will be contacted by phone or by letter within the next 1-3 weeks.  Please call us at [phone] if you have not heard about the biopsies in 3 weeks.    SIGNATURES/CONFIDENTIALITY: You and/or your care partner have signed paperwork which will be entered into your electronic medical record.  These signatures attest to the fact that that the information above on your After Visit Summary has been reviewed and is understood.  Full responsibility of the confidentiality of this discharge information lies with you and/or your care-partner.  Read all of the handouts given to you by your recovery room nurse. 

## 2017-04-01 NOTE — Progress Notes (Signed)
Pt's states no medical or surgical changes since previsit or office visit. 

## 2017-04-02 ENCOUNTER — Telehealth: Payer: Self-pay | Admitting: *Deleted

## 2017-04-02 ENCOUNTER — Telehealth: Payer: Self-pay

## 2017-04-02 NOTE — Telephone Encounter (Signed)
Left message on answering machine. 

## 2017-04-02 NOTE — Telephone Encounter (Signed)
No answer, left message to call if questions or concerns. 

## 2017-04-07 ENCOUNTER — Encounter: Payer: Self-pay | Admitting: Internal Medicine

## 2018-11-06 ENCOUNTER — Other Ambulatory Visit: Payer: Self-pay | Admitting: Family Medicine

## 2018-11-06 DIAGNOSIS — M5441 Lumbago with sciatica, right side: Secondary | ICD-10-CM

## 2018-11-25 ENCOUNTER — Encounter (INDEPENDENT_AMBULATORY_CARE_PROVIDER_SITE_OTHER): Payer: Self-pay

## 2018-11-25 ENCOUNTER — Ambulatory Visit
Admission: RE | Admit: 2018-11-25 | Discharge: 2018-11-25 | Disposition: A | Discharge status: Home / Self Care | Payer: Medicare Other | Source: Ambulatory Visit | Attending: Family Medicine | Admitting: Family Medicine

## 2018-11-25 DIAGNOSIS — M5441 Lumbago with sciatica, right side: Secondary | ICD-10-CM

## 2018-11-26 ENCOUNTER — Other Ambulatory Visit: Payer: Self-pay | Admitting: Neurosurgery

## 2018-11-26 DIAGNOSIS — M4316 Spondylolisthesis, lumbar region: Secondary | ICD-10-CM

## 2018-12-03 ENCOUNTER — Ambulatory Visit
Admission: RE | Admit: 2018-12-03 | Discharge: 2018-12-03 | Disposition: A | Discharge status: Home / Self Care | Payer: Medicare Other | Source: Ambulatory Visit | Attending: Neurosurgery | Admitting: Neurosurgery

## 2018-12-03 DIAGNOSIS — M4316 Spondylolisthesis, lumbar region: Secondary | ICD-10-CM

## 2018-12-03 MED ORDER — IOPAMIDOL (ISOVUE-M 200) INJECTION 41%
1.0000 mL | Freq: Once | INTRAMUSCULAR | Status: AC
  Administered 2018-12-03: 1 mL via EPIDURAL

## 2018-12-03 MED ORDER — METHYLPREDNISOLONE ACETATE 40 MG/ML INJ SUSP (RADIOLOG
120.0000 mg | Freq: Once | INTRAMUSCULAR | Status: AC
  Administered 2018-12-03: 120 mg via EPIDURAL

## 2018-12-03 NOTE — Discharge Instructions (Signed)

## 2019-03-08 DIAGNOSIS — C439 Malignant melanoma of skin, unspecified: Secondary | ICD-10-CM

## 2019-03-08 HISTORY — DX: Malignant melanoma of skin, unspecified: C43.9

## 2020-01-17 DIAGNOSIS — D239 Other benign neoplasm of skin, unspecified: Secondary | ICD-10-CM

## 2020-01-17 HISTORY — DX: Other benign neoplasm of skin, unspecified: D23.9

## 2020-01-31 ENCOUNTER — Ambulatory Visit (INDEPENDENT_AMBULATORY_CARE_PROVIDER_SITE_OTHER): Payer: Medicare Other | Admitting: Dermatology

## 2020-01-31 ENCOUNTER — Other Ambulatory Visit: Payer: Self-pay

## 2020-01-31 DIAGNOSIS — Z86018 Personal history of other benign neoplasm: Secondary | ICD-10-CM

## 2020-01-31 NOTE — Progress Notes (Signed)
   Follow-Up Visit   Subjective  Tom Mcknight is a 69 y.o. male who presents for the following: Suture / Staple Removal (Severe dysplastic nevus-margins free, spinal mid upper back). Post-operative visit He is here for a post-operative check.  He has been experiencing itching.    The following portions of the chart were reviewed this encounter and updated as appropriate:     Review of Systems: No other skin or systemic complaints.  Objective  Well appearing patient in no apparent distress; mood and affect are within normal limits.  A focused examination was performed including back. Relevant physical exam findings are noted in the Assessment and Plan.  Objective  Mid Back: Healing pink excision site. Pathology- no residual dysplastic nevus, margins free  Assessment & Plan  History of dysplastic nevus Mid Back  Subjective  Post-operative visit Tom Mcknight is a 69 y.o. male who is here for a post-operative check.  He has been experiencing no problems.   Review of Systems General: no fevers, chills, or night sweats  Objective  Wound:healing well, incision well approximated, no drainage, no dehiscence, minimal erythema, and no swelling  Assessment & Plan  Doing well postoperatively. Wound care discussed. Follow-up as ordered.

## 2020-01-31 NOTE — Progress Notes (Signed)
   Follow-Up Visit   Subjective  Tom Mcknight is a 69 y.o. male who presents for the following: Suture / Staple Removal (Severe dysplastic nevus-margins free, spinal mid upper back). Post-operative visit He is here for a post-operative check.  He has been experiencing no problems.    The following portions of the chart were reviewed this encounter and updated as appropriate:     Review of Systems: No other skin or systemic complaints.  Objective  Well appearing patient in no apparent distress; mood and affect are within normal limits.    No skin findings found.  Assessment & Plan     Wound cleansed, sutures removed, and steri strips applied to spinal mid upper back.  RTC for TBSE as scheduled 06/26/2020 9:15A.M.

## 2020-02-24 ENCOUNTER — Ambulatory Visit: Payer: Medicare Other

## 2020-06-26 ENCOUNTER — Other Ambulatory Visit: Payer: Self-pay

## 2020-06-26 ENCOUNTER — Ambulatory Visit (INDEPENDENT_AMBULATORY_CARE_PROVIDER_SITE_OTHER): Payer: Medicare Other | Admitting: Dermatology

## 2020-06-26 ENCOUNTER — Encounter: Payer: Self-pay | Admitting: Dermatology

## 2020-06-26 DIAGNOSIS — D485 Neoplasm of uncertain behavior of skin: Secondary | ICD-10-CM | POA: Diagnosis not present

## 2020-06-26 DIAGNOSIS — L738 Other specified follicular disorders: Secondary | ICD-10-CM

## 2020-06-26 DIAGNOSIS — L439 Lichen planus, unspecified: Secondary | ICD-10-CM

## 2020-06-26 DIAGNOSIS — L720 Epidermal cyst: Secondary | ICD-10-CM | POA: Diagnosis not present

## 2020-06-26 DIAGNOSIS — Z86018 Personal history of other benign neoplasm: Secondary | ICD-10-CM

## 2020-06-26 DIAGNOSIS — L814 Other melanin hyperpigmentation: Secondary | ICD-10-CM

## 2020-06-26 DIAGNOSIS — Z85820 Personal history of malignant melanoma of skin: Secondary | ICD-10-CM

## 2020-06-26 DIAGNOSIS — L700 Acne vulgaris: Secondary | ICD-10-CM

## 2020-06-26 DIAGNOSIS — L578 Other skin changes due to chronic exposure to nonionizing radiation: Secondary | ICD-10-CM

## 2020-06-26 NOTE — Progress Notes (Signed)
Follow-Up Visit   Subjective  Tom Mcknight is a 69 y.o. male who presents for the following: Annual Exam (Hx MM - vertex scalp treated at [facility], Hx of mod/severe dysplastic nevi on the R upper clavicle and spinal mid upper back). Patient has noticed some lesions on his scalp that his daughter has been watching for him.  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   The following portions of the chart were reviewed this encounter and updated as appropriate:      Review of Systems:  No other skin or systemic complaints except as noted in HPI or Assessment and Plan.  Objective  Well appearing patient in no apparent distress; mood and affect are within normal limits.  A full examination was performed including scalp, head, eyes, ears, nose, lips, neck, chest, axillae, abdomen, back, buttocks, bilateral upper extremities, bilateral lower extremities, hands, feet, fingers, toes, fingernails, and toenails. All findings within normal limits unless otherwise noted below.  Objective  vertex scalp: Well healed scar with no evidence of recurrence, no lymphadenopathy.   Objective  R upper clavicle, spinal mid upper back: Clear   Objective  Spinal mid upper back: 1.0 cm firm SQ nodule   Objective  inf vertex: 0.6 cm brown papule slightly waxy        Objective  B/L forearms: Small pink papules on the BL forearms  Objective  Face: Small yellow papules with a central dell.   Objective  L med canthus: 1.5 x 1.0 cm cluster of white papules and open comedones, present for years, no changes per pt   Objective  L post auricular scalp: 0.7 light brown macule    Assessment & Plan  History of melanoma vertex scalp  Clear. Observe for recurrence. Call clinic for new or changing lesions.  Recommend regular skin exams, daily broad-spectrum spf 30+ sunscreen use, and photoprotection.      History of dysplastic nevus R upper clavicle, spinal mid upper back  Clear. Observe for  recurrence. Call clinic for new or changing lesions.  Recommend regular skin exams, daily broad-spectrum spf 30+ sunscreen use, and photoprotection.     Epidermal inclusion cyst Spinal mid upper back  Reassured benign growth.  Recommend observation.  Discussed surgical excision in office if changes noted or symptomatic.   Neoplasm of uncertain behavior of skin inf vertex  Skin / nail biopsy Type of biopsy: tangential   Informed consent: discussed and consent obtained   Anesthesia: the lesion was anesthetized in a standard fashion   Anesthesia comment:  Area prepped with alcohol Anesthetic:  1% lidocaine w/ epinephrine 1-100,000 buffered w/ 8.4% NaHCO3 Instrument used: flexible razor blade   Hemostasis achieved with: pressure and aluminum chloride   Outcome: patient tolerated procedure well   Post-procedure details: wound care instructions given   Post-procedure details comment:  Ointment and small bandage applied  Specimen 1 - Surgical pathology Differential Diagnosis: SK r/o atypia - Hx of MM Check Margins: No 0.6 cm brown papule slightly waxy    Lichen planus B/L forearms  Bx proven on the right base of third finger dorsi (02/08/2019)  Continue Clobetasol shampoo and cream QD PRN flares.  Sebaceous hyperplasia Face  Benign, observe.     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    Lentigo L post auricular scalp  Benign-appearing.  Observation.  Call clinic for new or changing moles.  Recommend daily use of broad spectrum spf 30+ sunscreen to sun-exposed areas.  Hemangiomas - Red papules - Discussed benign nature - Observe - Call for any changes  Lentigines - Scattered tan macules - Discussed due to sun exposure - Benign, observe - Call for any changes  Seborrheic Keratoses - Stuck-on, waxy, tan-brown papules and plaques  - Discussed benign etiology and prognosis. - Observe - Call for any changes  Actinic Damage -  diffuse scaly erythematous macules with underlying dyspigmentation - Recommend daily broad spectrum sunscreen SPF 30+ to sun-exposed areas, reapply every 2 hours as needed.  - Call for new or changing lesions.  Melanocytic Nevi - Tan-brown and/or pink-flesh-colored symmetric macules and papules - Benign appearing on exam today - Observation - Call clinic for new or changing moles - Recommend daily use of broad spectrum spf 30+ sunscreen to sun-exposed areas.    Return in about 6 months (around 12/27/2020) for TBSE.  Luther Redo, CMA, am acting as scribe for Brendolyn Patty, MD .  Documentation: I have reviewed the above documentation for accuracy and completeness, and I agree with the above.  Brendolyn Patty MD

## 2020-06-26 NOTE — Patient Instructions (Addendum)
Shave Excision Benign Lesion Wound Care Instructions  . Leave the original bandage on for 24 hours if possible.  If the bandage becomes soaked or soiled before that time, it is OK to remove it and examine the wound.  A small amount of post-operative bleeding is normal.  If excessive bleeding occurs, remove the bandage, place gauze over the site and apply continuous pressure (no peeking) over the area for 20-30 minutes.  If this does not stop the bleeding, try again for 40 minutes.  If this does not work, please call our clinic as soon as possible (even if after-hours).    . Twice a day, cleanse the wound with soap and water.  If a thick crust develops you may use a Q-tip dipped into dilute hydrogen peroxide (mix 1:1 with water) to dissolve it.  Hydrogen peroxide can slow the healing process, so use it only as needed.  After washing, apply Vaseline jelly or Polysporin ointment.  For best healing, the wound should be covered with a layer of ointment at all times.  This may mean re-applying the ointment several times a day.  For open wounds, continue until it has healed.    . If you have any swelling, keep the area elevated.  . Some redness, tenderness and white or yellow material in the wound is normal healing.  If the area becomes very sore and red, or develops a thick yellow-green material (pus), it may be infected; please notify us.    . Wound healing continues for up to one year following surgery.  It is not unusual to experience pain in the scar from time to time during the interval.  If the pain becomes severe or the scar thickens, you should notify the office.  A slight amount of redness in a scar is expected for the first six months.  After six months, the redness subsides and the scar will soften and fade.  The color difference becomes less noticeable with time.  If there are any problems, return for a post-op surgery check at your earliest convenience.  . Please call our office for any questions  or concerns.   Melanoma ABCDEs  Melanoma is the most dangerous type of skin cancer, and is the leading cause of death from skin disease.  You are more likely to develop melanoma if you:  Have light-colored skin, light-colored eyes, or red or blond hair  Spend a lot of time in the sun  Tan regularly, either outdoors or in a tanning bed  Have had blistering sunburns, especially during childhood  Have a close family member who has had a melanoma  Have atypical moles or large birthmarks  Early detection of melanoma is key since treatment is typically straightforward and cure rates are extremely high if we catch it early.   The first sign of melanoma is often a change in a mole or a new dark spot.  The ABCDE system is a way of remembering the signs of melanoma.  A for asymmetry:  The two halves do not match. B for border:  The edges of the growth are irregular. C for color:  A mixture of colors are present instead of an even brown color. D for diameter:  Melanomas are usually (but not always) greater than 82mm - the size of a pencil eraser. E for evolution:  The spot keeps changing in size, shape, and color.  Please check your skin once per month between visits. You can use a small mirror in  front and a large mirror behind you to keep an eye on the back side or your body.   If you see any new or changing lesions before your next follow-up, please call to schedule a visit.  Please continue daily skin protection including broad spectrum sunscreen SPF 30+ to sun-exposed areas, reapplying every 2 hours as needed when you're outdoors.

## 2020-07-03 ENCOUNTER — Telehealth: Payer: Self-pay

## 2020-07-03 NOTE — Telephone Encounter (Signed)
-----   Message from Brendolyn Patty, MD sent at 07/03/2020  8:58 AM EDT ----- Skin , inf vertex PIGMENTED SEBORRHEIC KERATOSIS  Benign, no further treatment needed

## 2020-07-03 NOTE — Telephone Encounter (Signed)
Lft pt msg that bx was was benign SK.  Advised pt to call office if any questions./sh

## 2020-09-11 ENCOUNTER — Other Ambulatory Visit (HOSPITAL_COMMUNITY): Payer: Self-pay | Admitting: Ophthalmology

## 2020-09-11 ENCOUNTER — Other Ambulatory Visit: Payer: Self-pay | Admitting: Ophthalmology

## 2020-09-11 DIAGNOSIS — H4911 Fourth [trochlear] nerve palsy, right eye: Secondary | ICD-10-CM

## 2020-09-27 ENCOUNTER — Ambulatory Visit
Admission: RE | Admit: 2020-09-27 | Discharge: 2020-09-27 | Disposition: A | Discharge status: Home / Self Care | Payer: Medicare Other | Source: Ambulatory Visit | Attending: Ophthalmology | Admitting: Ophthalmology

## 2020-09-27 ENCOUNTER — Other Ambulatory Visit: Payer: Self-pay

## 2020-09-27 DIAGNOSIS — H4911 Fourth [trochlear] nerve palsy, right eye: Secondary | ICD-10-CM

## 2020-09-27 MED ORDER — GADOBUTROL 1 MMOL/ML IV SOLN
9.0000 mL | Freq: Once | INTRAVENOUS | Status: AC | PRN
  Administered 2020-09-27: 9 mL via INTRAVENOUS

## 2020-11-18 HISTORY — PX: WRIST SURGERY: SHX841

## 2021-01-01 ENCOUNTER — Ambulatory Visit: Payer: Medicare HMO | Admitting: Dermatology

## 2021-01-01 ENCOUNTER — Other Ambulatory Visit: Payer: Self-pay

## 2021-01-01 ENCOUNTER — Encounter: Payer: Self-pay | Admitting: Dermatology

## 2021-01-01 DIAGNOSIS — D2261 Melanocytic nevi of right upper limb, including shoulder: Secondary | ICD-10-CM

## 2021-01-01 DIAGNOSIS — Z86018 Personal history of other benign neoplasm: Secondary | ICD-10-CM

## 2021-01-01 DIAGNOSIS — L700 Acne vulgaris: Secondary | ICD-10-CM

## 2021-01-01 DIAGNOSIS — L720 Epidermal cyst: Secondary | ICD-10-CM

## 2021-01-01 DIAGNOSIS — D1800 Hemangioma unspecified site: Secondary | ICD-10-CM

## 2021-01-01 DIAGNOSIS — Z85820 Personal history of malignant melanoma of skin: Secondary | ICD-10-CM | POA: Diagnosis not present

## 2021-01-01 DIAGNOSIS — D225 Melanocytic nevi of trunk: Secondary | ICD-10-CM

## 2021-01-01 DIAGNOSIS — Z1283 Encounter for screening for malignant neoplasm of skin: Secondary | ICD-10-CM

## 2021-01-01 DIAGNOSIS — L918 Other hypertrophic disorders of the skin: Secondary | ICD-10-CM

## 2021-01-01 DIAGNOSIS — L814 Other melanin hyperpigmentation: Secondary | ICD-10-CM

## 2021-01-01 DIAGNOSIS — L738 Other specified follicular disorders: Secondary | ICD-10-CM

## 2021-01-01 DIAGNOSIS — L578 Other skin changes due to chronic exposure to nonionizing radiation: Secondary | ICD-10-CM

## 2021-01-01 DIAGNOSIS — D492 Neoplasm of unspecified behavior of bone, soft tissue, and skin: Secondary | ICD-10-CM

## 2021-01-01 DIAGNOSIS — D229 Melanocytic nevi, unspecified: Secondary | ICD-10-CM

## 2021-01-01 DIAGNOSIS — D2372 Other benign neoplasm of skin of left lower limb, including hip: Secondary | ICD-10-CM | POA: Diagnosis not present

## 2021-01-01 DIAGNOSIS — L439 Lichen planus, unspecified: Secondary | ICD-10-CM

## 2021-01-01 NOTE — Patient Instructions (Signed)

## 2021-01-01 NOTE — Progress Notes (Signed)
Follow-Up Visit   Subjective  Tom Mcknight is a 70 y.o. male who presents for the following: Total body skin exam (78m f/u, hx of Melanoma, dysplastic nevi).  He has a growth in the L groin area that gets irritated.   The following portions of the chart were reviewed this encounter and updated as appropriate:       Review of Systems:  No other skin or systemic complaints except as noted in HPI or Assessment and Plan.  Objective  Well appearing patient in no apparent distress; mood and affect are within normal limits.  A full examination was performed including scalp, head, eyes, ears, nose, lips, neck, chest, axillae, abdomen, back, buttocks, bilateral upper extremities, bilateral lower extremities, hands, feet, fingers, toes, fingernails, and toenails. All findings within normal limits unless otherwise noted below.  Objective  Vertex scalp: Well healed scar with no evidence of recurrence  Objective  Spinal mid upper back, R upper clavicle: Scar with no evidence of recurrence.   Objective  Spinal Mid Back: Firm SQ nodule with punctum  Objective  hands, arms: Hands, arms clear today  Right Upper Arm inferior; Right Lower Back  Objective  Right Upper Arm inferior: 5.52mm pink brown flat papule, mild scale  Right Lower Back: 4.77mm brown macule  Objective  L medial canthus: Numerous open and closed comedones  Objective  Right shoulder: 3.12mm med dark brown macule     Objective  Right Upper Arm superior: 3.75mm 2 tone brown macule     Objective  Left medial upper thigh/groin: Fleshy pap 4.101mm      Assessment & Plan    Skin cancer screening performed today.  Lentigines - Scattered tan macules - Discussed due to sun exposure - Benign, observe - Recommend daily broad spectrum sunscreen SPF 30+ to sun-exposed areas, reapply every 2 hours as needed. - Call for any changes  Hemangiomas - Red papules - Discussed benign nature - Observe - Call  for any changes  Seborrheic Keratoses - Stuck-on, waxy, tan-brown papules and plaques  - Discussed benign etiology and prognosis. - Observe - Call for any changes  Acrochordons (Skin Tags) - Fleshy, skin-colored pedunculated papules axilla - Benign appearing.  - Observe. - If desired, they can be removed with an in office procedure that is not covered by insurance. - Please call the clinic if you notice any new or changing lesions.  Sebaceous Hyperplasia - Small yellow papules with a central dell - Benign - Observe  History of melanoma Vertex scalp  0.94mm, Level II, treated at [facility] 02/2019  Clear. Observe for recurrence. Call clinic for new or changing lesions.  Recommend regular skin exams, daily broad-spectrum spf 30+ sunscreen use, and photoprotection.     History of dysplastic nevus Spinal mid upper back, R upper clavicle  Clear. Observe for recurrence. Call clinic for new or changing lesions.  Recommend regular skin exams, daily broad-spectrum spf 30+ sunscreen use, and photoprotection.     Epidermal cyst Spinal Mid Back  Benign-appearing. Exam most consistent with an epidermal inclusion cyst. Discussed that a cyst is a benign growth that can grow over time and sometimes get irritated or inflamed. Recommend observation if it is not bothersome. Discussed option of surgical excision to remove it if it is growing, symptomatic, or other changes noted. Please call for new or changing lesions so they can be evaluated.    Lichen planus hands, arms  Clear today pt will call prn recurrence  Nevus (2)  Right Upper Arm inferior; Right Lower Back  Nevus vs Lichenoid keratosis, R upper arm inferior  Benign-appearing.  Observation.  Call clinic for new or changing moles.  Recommend daily use of broad spectrum spf 30+ sunscreen to sun-exposed areas.       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 Anesthesia: the lesion was anesthetized in a standard fashion   Anesthetic:  1% lidocaine w/ epinephrine 1-100,000 buffered w/ 8.4% NaHCO3 Instrument used: flexible razor blade   Hemostasis achieved with: pressure, aluminum chloride and electrodesiccation   Outcome: patient tolerated procedure well   Post-procedure details: wound care instructions given   Post-procedure details comment:  Ointment and small bandage applied  Specimen 1 - Surgical pathology Differential Diagnosis: D48.5 Nevus vs Dysplastic Nevus  Check Margins: yes 3.27mm med dark brown macule  Right Upper Arm superior  Epidermal / dermal shaving  Lesion diameter (cm):  0.5 Informed consent: discussed and consent obtained   Patient was prepped and draped in usual sterile fashion: area prepped with alcohol. Anesthesia: the lesion was anesthetized in a standard fashion   Anesthetic:  1% lidocaine w/ epinephrine 1-100,000 buffered w/ 8.4% NaHCO3 Instrument used: flexible razor blade and scissors   Hemostasis achieved with: pressure, aluminum chloride and electrodesiccation   Outcome: patient tolerated procedure well   Post-procedure details: wound care instructions given   Post-procedure details comment:  Ointment and small bandage applied  Specimen 2 - Surgical pathology Differential Diagnosis: D48.5 Nevus vs Dysplastic Nevus  Check Margins: yes 3.65mm 2 tone brown macule  Left medial upper thigh/groin  Epidermal / dermal shaving  Lesion diameter (cm):  0.6 Informed consent: discussed and consent obtained   Anesthesia: the lesion was anesthetized in a standard fashion   Anesthetic:  1% lidocaine w/ epinephrine 1-100,000 buffered w/ 8.4%  NaHCO3 Instrument used: scissors   Hemostasis achieved with: pressure, aluminum chloride and electrodesiccation   Outcome: patient tolerated procedure well   Post-procedure details: wound care instructions given   Additional details:  Mupirocin ointment and Bandaid applied    Specimen 3 - Surgical pathology Differential Diagnosis: D48.5 Irritated nevus vs irritated skin tag  Check Margins: No Fleshy pedunculated pap 4.78mm  Return in about 6 months (around 07/01/2021) for TBSE, Hx of Melanoma, Hx of Dysplastic nevi.   I, Othelia Pulling, RMA, am acting as scribe for Brendolyn Patty, MD .  Documentation: I have reviewed the above documentation for accuracy and completeness, and I agree with the above.  Brendolyn Patty MD

## 2021-01-03 ENCOUNTER — Telehealth: Payer: Self-pay

## 2021-01-03 NOTE — Telephone Encounter (Signed)
-----   Message from Brendolyn Patty, MD sent at 01/02/2021  6:39 PM EST ----- 1. Skin , right shoulder DYSPLASTIC COMPOUND NEVUS WITH SEVERE ATYPIA, CLOSE TO MARGIN, 2. Skin , right upper arm superior DYSPLASTIC COMPOUND NEVUS WITH SEVERE ATYPIA, CLOSE TO MARGIN, 3. Skin , left medial thigh FIBROEPITHELIAL POLYP WITH FEATURES OF VERRUCA, INFLAMED  1. and 2.  Severely atypical moles- both need excision 3. Benign skin tag, inflamed

## 2021-01-03 NOTE — Telephone Encounter (Signed)
Advised pt of bx results and scheduled pt for 2 surgeries on 01/22/21 at 3:30 and 01/29/21 at 1:30./sh

## 2021-01-22 ENCOUNTER — Ambulatory Visit: Payer: Medicare HMO | Admitting: Dermatology

## 2021-01-22 ENCOUNTER — Encounter: Payer: Self-pay | Admitting: Dermatology

## 2021-01-22 ENCOUNTER — Other Ambulatory Visit: Payer: Self-pay

## 2021-01-22 DIAGNOSIS — D2261 Melanocytic nevi of right upper limb, including shoulder: Secondary | ICD-10-CM | POA: Diagnosis not present

## 2021-01-22 DIAGNOSIS — D485 Neoplasm of uncertain behavior of skin: Secondary | ICD-10-CM

## 2021-01-22 NOTE — Patient Instructions (Signed)

## 2021-01-22 NOTE — Progress Notes (Signed)
   Follow-Up Visit   Subjective  Tom Mcknight is a 70 y.o. male who presents for the following: Severe Dysplastic nevi bx proven (R shoulder, R upper arm superior.  Pt presents for excision).   The following portions of the chart were reviewed this encounter and updated as appropriate:       Review of Systems:  No other skin or systemic complaints except as noted in HPI or Assessment and Plan.  Objective  Well appearing patient in no apparent distress; mood and affect are within normal limits.  A focused examination was performed including right shoulder. Relevant physical exam findings are noted in the Assessment and Plan.  Objective  Right Shoulder: Pink biopsy site.   Assessment & Plan  Neoplasm of uncertain behavior of skin Right Shoulder  Skin excision  Lesion length (cm):  0.6 Lesion width (cm):  0.6 Margin per side (cm):  0.2 Total excision diameter (cm):  1 Informed consent: discussed and consent obtained   Timeout: patient name, date of birth, surgical site, and procedure verified   Procedure prep:  Patient was prepped and draped in usual sterile fashion Prep type:  Povidone-iodine Anesthesia: the lesion was anesthetized in a standard fashion   Anesthetic:  1% lidocaine w/ epinephrine 1-100,000 buffered w/ 8.4% NaHCO3 (11cc) Instrument used: #15 blade   Hemostasis achieved with: pressure and electrodesiccation   Outcome: patient tolerated procedure well with no complications   Additional details:  Tag medial 12:00  Skin repair Complexity:  Intermediate Final length (cm):  2.2 Informed consent: discussed and consent obtained   Reason for type of repair: reduce tension to allow closure, reduce the risk of dehiscence, infection, and necrosis and reduce subcutaneous dead space and avoid a hematoma   Undermining: edges undermined   Subcutaneous layers (deep stitches):  Suture size:  3-0 Suture type: Vicryl (polyglactin 910)   Stitches:  Buried vertical  mattress Fine/surface layer approximation (top stitches):  Suture size:  4-0 Suture type: nylon   Stitches: simple interrupted   Suture removal (days):  7 Hemostasis achieved with: suture Outcome: patient tolerated procedure well with no complications   Post-procedure details: sterile dressing applied and wound care instructions given   Dressing type: pressure dressing (mupirocin)    Specimen 1 - Surgical pathology Differential Diagnosis: Dysplastic Nevus with Severe Atypia Check Margins: Yes Pink biopsy site. [id number] Tag medial 12:00  Return in about 1 week (around 01/29/2021) for suture removal.   I, Jamesetta Orleans, CMA, am acting as scribe for Brendolyn Patty, MD .  Documentation: I have reviewed the above documentation for accuracy and completeness, and I agree with the above.  Brendolyn Patty MD

## 2021-01-23 ENCOUNTER — Telehealth: Payer: Self-pay

## 2021-01-23 NOTE — Telephone Encounter (Signed)
Talked to patient and he is doing fine from surgery yesterday.  

## 2021-01-29 ENCOUNTER — Encounter: Payer: Self-pay | Admitting: Dermatology

## 2021-01-29 ENCOUNTER — Ambulatory Visit (INDEPENDENT_AMBULATORY_CARE_PROVIDER_SITE_OTHER): Payer: Medicare HMO | Admitting: Dermatology

## 2021-01-29 ENCOUNTER — Other Ambulatory Visit: Payer: Self-pay

## 2021-01-29 DIAGNOSIS — D2361 Other benign neoplasm of skin of right upper limb, including shoulder: Secondary | ICD-10-CM

## 2021-01-29 DIAGNOSIS — D239 Other benign neoplasm of skin, unspecified: Secondary | ICD-10-CM

## 2021-01-29 DIAGNOSIS — D485 Neoplasm of uncertain behavior of skin: Secondary | ICD-10-CM | POA: Diagnosis not present

## 2021-01-29 DIAGNOSIS — D492 Neoplasm of unspecified behavior of bone, soft tissue, and skin: Secondary | ICD-10-CM

## 2021-01-29 NOTE — Progress Notes (Signed)
   Follow-Up Visit   Subjective  Tom Mcknight is a 70 y.o. male who presents for the following: severe dysplastic nevus bx proven (R upper arm superior, pt presents for excision) and dysplastic nevus margins free bx proven (R shoulder, 1 week post op, pt presents for suture removal).   The following portions of the chart were reviewed this encounter and updated as appropriate:       Review of Systems:  No other skin or systemic complaints except as noted in HPI or Assessment and Plan.  Objective  Well appearing patient in no apparent distress; mood and affect are within normal limits.  A focused examination was performed including right arm. Relevant physical exam findings are noted in the Assessment and Plan.  Objective  Right upper arm superior: Pink bx site 5.60mm  Objective  Right Shoulder: Healing excision site clean, dry and intact    Assessment & Plan  Neoplasm of skin Right upper arm superior  Skin excision  Lesion length (cm):  0.5 Lesion width (cm):  0.5 Margin per side (cm):  0.2 Total excision diameter (cm):  0.9 Informed consent: discussed and consent obtained   Timeout: patient name, date of birth, surgical site, and procedure verified   Procedure prep:  Patient was prepped and draped in usual sterile fashion Prep type:  Povidone-iodine Anesthesia: the lesion was anesthetized in a standard fashion   Anesthetic:  1% lidocaine w/ epinephrine 1-100,000 buffered w/ 8.4% NaHCO3 (8.0cc) Instrument used: #15 blade   Hemostasis achieved with: pressure and electrodesiccation   Outcome: patient tolerated procedure well with no complications   Additional details:  Tag superior 12:00  Skin repair Complexity:  Intermediate Final length (cm):  2.8 Informed consent: discussed and consent obtained   Reason for type of repair: reduce tension to allow closure, reduce the risk of dehiscence, infection, and necrosis and reduce subcutaneous dead space and avoid a  hematoma   Undermining: edges undermined   Subcutaneous layers (deep stitches):  Suture size:  4-0 Suture type: Vicryl (polyglactin 910)   Stitches:  Buried vertical mattress Fine/surface layer approximation (top stitches):  Suture size:  4-0 Suture type: nylon   Suture type comment:  Nylon Stitches: simple interrupted   Suture removal (days):  7 Hemostasis achieved with: suture Outcome: patient tolerated procedure well with no complications   Post-procedure details: sterile dressing applied and wound care instructions given   Dressing type: pressure dressing (Mupirocin)    Specimen 1 - Surgical pathology Differential Diagnosis: D48.5 Bx proven severe dysplastic nevus Check Margins: yes Pink bx site 5.52mm [id number] Tag superior 12:00  Severe dysplastic nevus bx proven, excised today  Dysplastic nevus Right Shoulder  Margins free, bx proven  Healing excision site  Wound cleansed, sutures removed, wound cleansed and steri strips applied. Discussed pathology results.   Return in about 1 week (around 02/05/2021) for post-op[.  I, Sonya Hupman, RMA, am acting as scribe for Brendolyn Patty, MD .  Documentation: I have reviewed the above documentation for accuracy and completeness, and I agree with the above.  Brendolyn Patty MD

## 2021-01-29 NOTE — Patient Instructions (Signed)

## 2021-01-30 ENCOUNTER — Telehealth: Payer: Self-pay

## 2021-01-30 NOTE — Telephone Encounter (Signed)
Patient doing well after yesterdays surgery./sh 

## 2021-02-06 ENCOUNTER — Ambulatory Visit (INDEPENDENT_AMBULATORY_CARE_PROVIDER_SITE_OTHER): Payer: Medicare HMO | Admitting: Dermatology

## 2021-02-06 ENCOUNTER — Other Ambulatory Visit: Payer: Self-pay

## 2021-02-06 ENCOUNTER — Encounter: Payer: Self-pay | Admitting: Dermatology

## 2021-02-06 DIAGNOSIS — D239 Other benign neoplasm of skin, unspecified: Secondary | ICD-10-CM

## 2021-02-06 DIAGNOSIS — D2361 Other benign neoplasm of skin of right upper limb, including shoulder: Secondary | ICD-10-CM

## 2021-02-06 NOTE — Progress Notes (Signed)
   Follow-Up Visit   Subjective  Tom Mcknight is a 70 y.o. male who presents for the following: Post op (Severe Dysplastic nevus, margins free of the right upper arm superior.).   The following portions of the chart were reviewed this encounter and updated as appropriate:       Review of Systems:  No other skin or systemic complaints except as noted in HPI or Assessment and Plan.  Objective  Well appearing patient in no apparent distress; mood and affect are within normal limits.  A focused examination was performed including R arm and shoulder. Relevant physical exam findings are noted in the Assessment and Plan.  Objective  Right upper arm superior: Healing excision site. clean, dry and intact    Assessment & Plan  Dysplastic nevus Right upper arm superior  Healing well Wound cleansed, sutures removed, wound cleansed and steri strips applied. Discussed pathology results.  Margins free   Return as scheduled.   IJamesetta Orleans, CMA, am acting as scribe for Brendolyn Patty, MD .  Documentation: I have reviewed the above documentation for accuracy and completeness, and I agree with the above.  Brendolyn Patty MD

## 2021-06-18 HISTORY — PX: OTHER SURGICAL HISTORY: SHX169

## 2021-07-03 ENCOUNTER — Other Ambulatory Visit: Payer: Self-pay

## 2021-07-03 ENCOUNTER — Ambulatory Visit: Payer: Medicare HMO | Admitting: Dermatology

## 2021-07-03 DIAGNOSIS — L720 Epidermal cyst: Secondary | ICD-10-CM

## 2021-07-03 DIAGNOSIS — L814 Other melanin hyperpigmentation: Secondary | ICD-10-CM

## 2021-07-03 DIAGNOSIS — Z86018 Personal history of other benign neoplasm: Secondary | ICD-10-CM

## 2021-07-03 DIAGNOSIS — Z85820 Personal history of malignant melanoma of skin: Secondary | ICD-10-CM | POA: Diagnosis not present

## 2021-07-03 DIAGNOSIS — D1800 Hemangioma unspecified site: Secondary | ICD-10-CM

## 2021-07-03 DIAGNOSIS — L700 Acne vulgaris: Secondary | ICD-10-CM

## 2021-07-03 DIAGNOSIS — Z1283 Encounter for screening for malignant neoplasm of skin: Secondary | ICD-10-CM | POA: Diagnosis not present

## 2021-07-03 DIAGNOSIS — L578 Other skin changes due to chronic exposure to nonionizing radiation: Secondary | ICD-10-CM

## 2021-07-03 DIAGNOSIS — L821 Other seborrheic keratosis: Secondary | ICD-10-CM

## 2021-07-03 DIAGNOSIS — D225 Melanocytic nevi of trunk: Secondary | ICD-10-CM | POA: Diagnosis not present

## 2021-07-03 DIAGNOSIS — D229 Melanocytic nevi, unspecified: Secondary | ICD-10-CM

## 2021-07-03 NOTE — Progress Notes (Signed)
Follow-Up Visit   Subjective  Tom Mcknight is a 70 y.o. male who presents for the following: Follow-up (Patient presents for 6 month TBSE. He hasn't noticed any new or changing spots. He has a history of Melanoma of the vertex scalp and history of multiple dysplastic nevi. ). H/o lichen planus.   The following portions of the chart were reviewed this encounter and updated as appropriate:       Review of Systems:  No other skin or systemic complaints except as noted in HPI or Assessment and Plan.  Objective  Well appearing patient in no apparent distress; mood and affect are within normal limits.  A full examination was performed including scalp, head, eyes, ears, nose, lips, neck, chest, axillae, abdomen, back, buttocks, bilateral upper extremities, bilateral lower extremities, hands, feet, fingers, toes, fingernails, and toenails. All findings within normal limits unless otherwise noted below.  Vertex Scalp Well healed scar with no evidence of recurrence  Right Shoulder, R upper arm superior, spinal mid upper back, R upper clavicle Scar with no evidence of recurrence.   R lower back 4.75m brown macule  Bilateral Medial Canthus Numerous open and closed comedones, L med canthus > R  Spinal Mid Back Subcutaneous nodule.    Assessment & Plan  Skin cancer screening performed today.  Actinic Damage - chronic, secondary to cumulative UV radiation exposure/sun exposure over time - diffuse scaly erythematous macules with underlying dyspigmentation - Recommend daily broad spectrum sunscreen SPF 30+ to sun-exposed areas, reapply every 2 hours as needed.  - Recommend staying in the shade or wearing long sleeves, sun glasses (UVA+UVB protection) and wide brim hats (4-inch brim around the entire circumference of the hat). - Call for new or changing lesions.  Lentigines - Scattered tan macules, including 3.063mbrown macule of the right inferior vertex. - Due to sun exposure -  Benign-appering, observe - Recommend daily broad spectrum sunscreen SPF 30+ to sun-exposed areas, reapply every 2 hours as needed. - Call for any changes  Seborrheic Keratoses - Stuck-on, waxy, tan-brown papules and/or plaques  - Benign-appearing - Discussed benign etiology and prognosis. - Observe - Call for any changes  Hemangiomas - Red papules - Discussed benign nature - Observe - Call for any changes  History of melanoma Vertex Scalp  Clear. Observe for recurrence. Call clinic for new or changing lesions.  Recommend regular skin exams, daily broad-spectrum spf 30+ sunscreen use, and photoprotection.    0.86m49mLevel II, [facility]  History of dysplastic nevus Right Shoulder, R upper arm superior, spinal mid upper back, R upper clavicle  Clear. Observe for recurrence. Call clinic for new or changing lesions.  Recommend regular skin exams, daily broad-spectrum spf 30+ sunscreen use, and photoprotection.    Nevus R lower back  Benign-appearing.  Stable. Observation.  Call clinic for new or changing moles.  Recommend daily use of broad spectrum spf 30+ sunscreen to sun-exposed areas.   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 Observe.   Discussed extraction, topical treatment with retinoid. Patient may schedule in the future.    Samples of Effaclar 0.1% Gel - apply a small amount at night as tolerated.  Epidermal cyst Spinal Mid Back  Benign-appearing. Exam most consistent with an epidermal inclusion cyst. Discussed that a cyst is a benign growth that can grow over time and sometimes get irritated or inflamed. Recommend observation if it is not bothersome. Discussed option of surgical excision to remove it if  it is growing, symptomatic, or other changes noted. Please call for new or changing lesions so they can be evaluated.    Return in about 6 months (around 01/03/2022) for TBSE.  IJamesetta Orleans, CMA, am acting as  scribe for Brendolyn Patty, MD .  Documentation: I have reviewed the above documentation for accuracy and completeness, and I agree with the above.  Brendolyn Patty MD

## 2021-07-03 NOTE — Patient Instructions (Signed)

## 2022-01-08 ENCOUNTER — Other Ambulatory Visit: Payer: Self-pay

## 2022-01-08 ENCOUNTER — Ambulatory Visit: Payer: Medicare HMO | Admitting: Dermatology

## 2022-01-08 DIAGNOSIS — D489 Neoplasm of uncertain behavior, unspecified: Secondary | ICD-10-CM

## 2022-01-08 DIAGNOSIS — Z85820 Personal history of malignant melanoma of skin: Secondary | ICD-10-CM

## 2022-01-08 DIAGNOSIS — L700 Acne vulgaris: Secondary | ICD-10-CM

## 2022-01-08 DIAGNOSIS — D1800 Hemangioma unspecified site: Secondary | ICD-10-CM

## 2022-01-08 DIAGNOSIS — Z1283 Encounter for screening for malignant neoplasm of skin: Secondary | ICD-10-CM

## 2022-01-08 DIAGNOSIS — D485 Neoplasm of uncertain behavior of skin: Secondary | ICD-10-CM

## 2022-01-08 DIAGNOSIS — D225 Melanocytic nevi of trunk: Secondary | ICD-10-CM | POA: Diagnosis not present

## 2022-01-08 DIAGNOSIS — L720 Epidermal cyst: Secondary | ICD-10-CM | POA: Diagnosis not present

## 2022-01-08 DIAGNOSIS — L821 Other seborrheic keratosis: Secondary | ICD-10-CM

## 2022-01-08 DIAGNOSIS — Z86018 Personal history of other benign neoplasm: Secondary | ICD-10-CM

## 2022-01-08 DIAGNOSIS — D229 Melanocytic nevi, unspecified: Secondary | ICD-10-CM

## 2022-01-08 DIAGNOSIS — L578 Other skin changes due to chronic exposure to nonionizing radiation: Secondary | ICD-10-CM

## 2022-01-08 DIAGNOSIS — L814 Other melanin hyperpigmentation: Secondary | ICD-10-CM

## 2022-01-08 NOTE — Patient Instructions (Signed)
Biopsy Wound Care Instructions  Leave the original bandage on for 24 hours if possible.  If the bandage becomes soaked or soiled before that time, it is OK to remove it and examine the wound.  A small amount of post-operative bleeding is normal.  If excessive bleeding occurs, remove the bandage, place gauze over the site and apply continuous pressure (no peeking) over the area for 30 minutes. If this does not work, please call our clinic as soon as possible or page your doctor if it is after hours.   Once a day, cleanse the wound with soap and water. It is fine to shower. If a thick crust develops you may use a Q-tip dipped into dilute hydrogen peroxide (mix 1:1 with water) to dissolve it.  Hydrogen peroxide can slow the healing process, so use it only as needed.    After washing, apply petroleum jelly (Vaseline) or an antibiotic ointment if your doctor prescribed one for you, followed by a bandage.    For best healing, the wound should be covered with a layer of ointment at all times. If you are not able to keep the area covered with a bandage to hold the ointment in place, this may mean re-applying the ointment several times a day.  Continue this wound care until the wound has healed and is no longer open.   Itching and mild discomfort is normal during the healing process. However, if you develop pain or severe itching, please call our office.   If you have any discomfort, you can take Tylenol (acetaminophen) or ibuprofen as directed on the bottle. (Please do not take these if you have an allergy to them or cannot take them for another reason).  Some redness, tenderness and white or yellow material in the wound is normal healing.  If the area becomes very sore and red, or develops a thick yellow-green material (pus), it may be infected; please notify us.    If you have stitches, return to clinic as directed to have the stitches removed. You will continue wound care for 2-3 days after the stitches  are removed.   Wound healing continues for up to one year following surgery. It is not unusual to experience pain in the scar from time to time during the interval.  If the pain becomes severe or the scar thickens, you should notify the office.    A slight amount of redness in a scar is expected for the first six months.  After six months, the redness will fade and the scar will soften and fade.  The color difference becomes less noticeable with time.  If there are any problems, return for a post-op surgery check at your earliest convenience.  To improve the appearance of the scar, you can use silicone scar gel, cream, or sheets (such as Mederma or Serica) every night for up to one year. These are available over the counter (without a prescription).  Please call our office at [phone] for any questions or concerns.  Seborrheic Keratosis  What causes seborrheic keratoses? Seborrheic keratoses are harmless, common skin growths that first appear during adult life.  As time goes by, more growths appear.  Some people may develop a large number of them.  Seborrheic keratoses appear on both covered and uncovered body parts.  They are not caused by sunlight.  The tendency to develop seborrheic keratoses can be inherited.  They vary in color from skin-colored to gray, brown, or even black.  They can be either  smooth or have a rough, warty surface.   Seborrheic keratoses are superficial and look as if they were stuck on the skin.  Under the microscope this type of keratosis looks like layers upon layers of skin.  That is why at times the top layer may seem to fall off, but the rest of the growth remains and re-grows.    Treatment Seborrheic keratoses do not need to be treated, but can easily be removed in the office.  Seborrheic keratoses often cause symptoms when they rub on clothing or jewelry.  Lesions can be in the way of shaving.  If they become inflamed, they can cause itching, soreness, or  burning.  Removal of a seborrheic keratosis can be accomplished by freezing, burning, or surgery. If any spot bleeds, scabs, or grows rapidly, please return to have it checked, as these can be an indication of a skin cancer.  Melanoma ABCDEs  Melanoma is the most dangerous type of skin cancer, and is the leading cause of death from skin disease.  You are more likely to develop melanoma if you: Have light-colored skin, light-colored eyes, or red or blond hair Spend a lot of time in the sun Tan regularly, either outdoors or in a tanning bed Have had blistering sunburns, especially during childhood Have a close family member who has had a melanoma Have atypical moles or large birthmarks  Early detection of melanoma is key since treatment is typically straightforward and cure rates are extremely high if we catch it early.   The first sign of melanoma is often a change in a mole or a new dark spot.  The ABCDE system is a way of remembering the signs of melanoma.  A for asymmetry:  The two halves do not match. B for border:  The edges of the growth are irregular. C for color:  A mixture of colors are present instead of an even brown color. D for diameter:  Melanomas are usually (but not always) greater than 71mm - the size of a pencil eraser. E for evolution:  The spot keeps changing in size, shape, and color.  Please check your skin once per month between visits. You can use a small mirror in front and a large mirror behind you to keep an eye on the back side or your body.   If you see any new or changing lesions before your next follow-up, please call to schedule a visit.  Please continue daily skin protection including broad spectrum sunscreen SPF 30+ to sun-exposed areas, reapplying every 2 hours as needed when you're outdoors.    If You Need Anything After Your Visit  If you have any questions or concerns for your doctor, please call our main line at [phone] and press option 4 to  reach your doctor's medical assistant. If no one answers, please leave a voicemail as directed and we will return your call as soon as possible. Messages left after 4 pm will be answered the following business day.   You may also send [place] a message via [place]. We typically respond to MyChart messages within 1-2 business days.  For prescription refills, please ask your pharmacy to contact our office. Our fax number is [phone].  If you have an urgent issue when the clinic is closed that cannot wait until the next business day, you can page your doctor at the number below.    Please note that while we do our best to be available for urgent issues outside of office  hours, we are not available 24/7.   If you have an urgent issue and are unable to reach [place], you may choose to seek medical care at your doctor's office, retail clinic, urgent care center, or emergency room.  If you have a medical emergency, please immediately call 911 or go to the emergency department.  Pager Numbers  - Dr. Nehemiah Massed: [phone]  - Dr. Laurence Ferrari: [phone]  - Dr. Nicole Kindred: [phone]  In the event of inclement weather, please call our main line at [phone] for an update on the status of any delays or closures.  Dermatology Medication Tips: Please keep the boxes that topical medications come in in order to help keep track of the instructions about where and how to use these. Pharmacies typically print the medication instructions only on the boxes and not directly on the medication tubes.   If your medication is too expensive, please contact our office at [phone] option 4 or send [place] a message through [place].   We are unable to tell what your co-pay for medications will be in advance as this is different depending on your insurance coverage. However, we may be able to find a substitute medication at lower cost or fill out paperwork to get insurance to cover a needed medication.   If a prior  authorization is required to get your medication covered by your insurance company, please allow [place] 1-2 business days to complete this process.  Drug prices often vary depending on where the prescription is filled and some pharmacies may offer cheaper prices.  The website www.goodrx.com contains coupons for medications through different pharmacies. The prices here do not account for what the cost may be with help from insurance (it may be cheaper with your insurance), but the website can give you the price if you did not use any insurance.  - You can print the associated coupon and take it with your prescription to the pharmacy.  - You may also stop by our office during regular business hours and pick up a GoodRx coupon card.  - If you need your prescription sent electronically to a different pharmacy, notify our office through [facility] or by phone at [phone] option 4.     Si Usted Necesita Algo Despus de Su Visita  Tambin puede enviarnos un mensaje a travs de Pharmacist, community. Por lo general respondemos a los mensajes de MyChart en el transcurso de 1 a 2 das hbiles.  Para renovar recetas, por favor pida a su farmacia que se ponga en contacto con nuestra oficina. Harland Dingwall de fax es [place] [phone].  Si tiene un asunto urgente cuando la clnica est cerrada y que no puede esperar hasta el siguiente da hbil, puede llamar/localizar a su doctor(a) al nmero que aparece a continuacin.   Por favor, tenga en cuenta que aunque hacemos todo lo posible para estar disponibles para asuntos urgentes fuera del horario de [place], no estamos disponibles las 24 horas del da, los 7 das de la [place].   Si tiene un problema urgente y no puede comunicarse con nosotros, puede optar por buscar atencin mdica  en el consultorio de su doctor(a), en una clnica privada, en un centro de atencin urgente o en una sala de emergencias.  Si tiene Engineering geologist, por favor llame  inmediatamente al 911 o vaya a la sala de emergencias.  Nmeros de bper  - Dr. Nehemiah Massed: [phone]  - Dra. Moye: [phone]  - Dra. Nicole Kindred: [phone]  En caso de inclemencias del  tiempo, por favor llame a [place] Nipper lnea principal al [phone] para una actualizacin sobre el [place] de cualquier retraso o cierre.  Consejos para la medicacin en dermatologa: Por favor, guarde las cajas en las que vienen los medicamentos de uso tpico para ayudarle a seguir las instrucciones sobre dnde y cmo usarlos. Las farmacias generalmente imprimen las instrucciones del medicamento slo en las cajas y no directamente en los tubos del [place].   Si su medicamento es muy caro, por favor, pngase en contacto con Zigmund Daniel llamando al [phone] y presione la opcin 4 o envenos un mensaje a travs de Pharmacist, community.   No podemos decirle cul ser su copago por los medicamentos por adelantado ya que esto es diferente dependiendo de la cobertura de su seguro. Sin embargo, es posible que podamos encontrar un medicamento sustituto a Electrical engineer un formulario para que el seguro cubra el medicamento que se considera necesario.   Si se requiere una autorizacin previa para que su compaa de seguros [place] su medicamento, por favor permtanos de 1 a 2 das hbiles para completar este proceso.  Los precios de los medicamentos varan con frecuencia dependiendo del Environmental consultant de dnde se surte la receta y alguna farmacias pueden ofrecer precios ms baratos.  El sitio web www.goodrx.com tiene cupones para medicamentos de Airline pilot. Los precios aqu no tienen en cuenta lo que podra costar con la ayuda del seguro (puede ser ms barato con su seguro), pero el sitio web puede darle el precio si no utiliz Research scientist (physical sciences).  - Puede imprimir el cupn correspondiente y llevarlo con su receta a la farmacia.  - Tambin puede pasar por nuestra oficina durante el horario de atencin regular y Charity fundraiser  una tarjeta de cupones de GoodRx.  - Si necesita que su receta se enve electrnicamente a una farmacia diferente, informe a nuestra oficina a travs de MyChart de [place] o por telfono llamando al [phone] y presione la opcin 4.

## 2022-01-08 NOTE — Progress Notes (Signed)
Follow-Up Visit   Subjective  Tom Mcknight is a 71 y.o. male who presents for the following: Follow-up (Patient here today for 6 month tbse. Patient has history of dysplastic nevus and melanoma. ).  He has a h/o lichen planus which is in remission.  The patient presents for Upper Body Skin Exam (UBSE) for skin cancer screening and mole check.  The patient has spots, moles and lesions to be evaluated, some may be new or changing and the patient has concerns that these could be cancer.  The following portions of the chart were reviewed this encounter and updated as appropriate:      Review of Systems: No other skin or systemic complaints except as noted in HPI or Assessment and Plan.   Objective  Well appearing patient in no apparent distress; mood and affect are within normal limits.  All skin waist up examined. And bilateral legs   spinal mid back Subcutaneous nodule.   right inferior vertex 4.0 mm speckled brown macule      Right Lower Back 4 mm brown macule   Bilateral Medial Canthus Numerous open and closed comedones, L med canthus > R   Assessment & Plan  Epidermal inclusion cyst spinal mid back  Benign-appearing. Exam most consistent with an epidermal inclusion cyst. Discussed that a cyst is a benign growth that can grow over time and sometimes get irritated or inflamed. Recommend observation if it is not bothersome. Discussed option of surgical excision to remove it if it is growing, symptomatic, or other changes noted. Please call for new or changing lesions so they can be evaluated.    Neoplasm of uncertain behavior right inferior vertex  Epidermal / dermal shaving  Lesion diameter (cm):  0.6 Informed consent: discussed and consent obtained   Patient was prepped and draped in usual sterile fashion: Area prepped with alcohol. Anesthesia: the lesion was anesthetized in a standard fashion   Anesthetic:  1% lidocaine w/ epinephrine 1-100,000 buffered w/  8.4% NaHCO3 Instrument used: flexible razor blade   Hemostasis achieved with: pressure, aluminum chloride and electrodesiccation   Outcome: patient tolerated procedure well   Post-procedure details: wound care instructions given   Post-procedure details comment:  Ointment and small bandage applied.   Specimen 1 - Surgical pathology Differential Diagnosis: R/o lentigo vs atypia h/o of melanoma  Check Margins: No  R/o lentigo vs atypia h/o of melanoma   Nevus Right Lower Back  Benign-appearing.  Observation. Stable.  Call clinic for new or changing lesions.  Recommend daily use of broad spectrum spf 30+ sunscreen to sun-exposed areas.    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 Observe.    Discussed extraction, topical treatment with retinoid. Patient may schedule in the future.    Continue Effaclar 0.1% Gel - apply a small amount at night as tolerated.   Lentigines - Scattered tan macules,  - Due to sun exposure - Benign-appearing, observe - Recommend daily broad spectrum sunscreen SPF 30+ to sun-exposed areas, reapply every 2 hours as needed. - Call for any changes  Sebaceous Hyperplasia - Small yellow papules with a central dell - Benign - Observe  Seborrheic Keratoses - Stuck-on, waxy, tan-brown papules and/or plaques  - Benign-appearing - Discussed benign etiology and prognosis. - Observe - Call for any changes  Melanocytic Nevi - Tan-brown and/or pink-flesh-colored symmetric macules and papules - Benign appearing on exam today - Observation - Call clinic for new or changing moles - Recommend daily use of broad  spectrum spf 30+ sunscreen to sun-exposed areas.   Hemangiomas - Red papules - Discussed benign nature - Observe - Call for any changes  Actinic Damage - Chronic condition, secondary to cumulative UV/sun exposure - diffuse scaly erythematous macules with underlying dyspigmentation - Recommend daily broad spectrum  sunscreen SPF 30+ to sun-exposed areas, reapply every 2 hours as needed.  - Staying in the shade or wearing long sleeves, sun glasses (UVA+UVB protection) and wide brim hats (4-inch brim around the entire circumference of the hat) are also recommended for sun protection.  - Call for new or changing lesions.  History of Melanoma 0.59mm. Level II, 02/2019 [facility] - No evidence of recurrence today at vertex scalp (2020) - Recommend regular full body skin exams - Recommend daily broad spectrum sunscreen SPF 30+ to sun-exposed areas, reapply every 2 hours as needed.  - Call if any new or changing lesions are noted between office visits  History of Dysplastic Nevi - No evidence of recurrence today at multiple locations  - Recommend regular full body skin exams - Recommend daily broad spectrum sunscreen SPF 30+ to sun-exposed areas, reapply every 2 hours as needed.  - Call if any new or changing lesions are noted between office visits  Skin cancer screening performed today. Return for 6 month tbse h/o melanoma . I, Ruthell Rummage, CMA, am acting as scribe for Brendolyn Patty, MD.  Documentation: I have reviewed the above documentation for accuracy and completeness, and I agree with the above.  Brendolyn Patty MD

## 2022-01-09 ENCOUNTER — Telehealth: Payer: Self-pay

## 2022-01-09 NOTE — Telephone Encounter (Signed)
-----   Message from Brendolyn Patty, MD sent at 01/09/2022  2:54 PM EST ----- Skin , right inferior vertex PIGMENTED SEBORRHEIC KERATOSIS  Benign "age spot", no further treatment needed - please call patient

## 2022-01-09 NOTE — Telephone Encounter (Signed)
Advised pt of bx results/sh ?

## 2022-01-22 ENCOUNTER — Encounter: Payer: Self-pay | Admitting: Internal Medicine

## 2022-03-07 ENCOUNTER — Ambulatory Visit (AMBULATORY_SURGERY_CENTER): Payer: Medicare HMO | Admitting: *Deleted

## 2022-03-07 VITALS — Ht 73.0 in | Wt 210.0 lb

## 2022-03-07 DIAGNOSIS — Z86010 Personal history of colonic polyps: Secondary | ICD-10-CM

## 2022-03-07 MED ORDER — NA SULFATE-K SULFATE-MG SULF 17.5-3.13-1.6 GM/177ML PO SOLN: 1.0000 | Freq: Once | ORAL | 0 refills | Status: AC

## 2022-03-07 NOTE — Progress Notes (Signed)
No egg or soy allergy known to patient  ?No issues known to pt with past sedation with any surgeries or procedures ?Patient denies ever being told they had issues or difficulty with intubation  ?No FH of Malignant Hyperthermia ?Pt is not on diet pills ?Pt is not on  home 02  ?Pt is not on blood thinners  ?Pt denies issues with constipation  ?No A fib or A flutter ? ? NO PA's for preps discussed with pt In PV today  ?Discussed with pt there will be an out-of-pocket cost for prep and that varies from $0 to 70 +  dollars - pt verbalized understanding  ?Pt instructed to use Singlecare.com or GoodRx for a price reduction on prep  ? ?PV completed over the phone. Pt verified name, DOB, address and insurance during PV today.  ?Pt mailed instruction packet with copy of consent form to read and not return, and instructions.  ?Pt encouraged to call with questions or issues.  ? ?Insurance confirmed at [facility]  ?

## 2022-03-08 ENCOUNTER — Telehealth: Payer: Self-pay | Admitting: Internal Medicine

## 2022-03-08 DIAGNOSIS — Z86010 Personal history of colonic polyps: Secondary | ICD-10-CM

## 2022-03-08 MED ORDER — NA SULFATE-K SULFATE-MG SULF 17.5-3.13-1.6 GM/177ML PO SOLN: 1.0000 | ORAL | 0 refills | Status: DC

## 2022-03-08 NOTE — Telephone Encounter (Signed)
Suprep is $80 at his local pharmacy. Pt request [facility] to [facility] to use the singlecare coupon. [facility]'s [facility]. Pt instructed on how to use the singlecare app. ?

## 2022-03-08 NOTE — Telephone Encounter (Signed)
Patient called requesting to speak with nurse. Per patient, was told by "Lelan Pons" to call back if the prep was more than $20-$30. Please advise. ?

## 2022-03-28 ENCOUNTER — Ambulatory Visit (AMBULATORY_SURGERY_CENTER): Payer: Medicare HMO | Admitting: Internal Medicine

## 2022-03-28 ENCOUNTER — Encounter: Payer: Self-pay | Admitting: Internal Medicine

## 2022-03-28 VITALS — BP 127/86 | HR 66 | Temp 97.5°F | Resp 19 | Ht 73.0 in | Wt 210.0 lb

## 2022-03-28 DIAGNOSIS — D123 Benign neoplasm of transverse colon: Secondary | ICD-10-CM

## 2022-03-28 DIAGNOSIS — Z86010 Personal history of colonic polyps: Secondary | ICD-10-CM | POA: Diagnosis present

## 2022-03-28 MED ORDER — SODIUM CHLORIDE 0.9 % IV SOLN: 500.0000 mL | Freq: Once | INTRAVENOUS | Status: DC

## 2022-03-28 NOTE — Progress Notes (Signed)
Report to PACU, RN, vss, BBS= Clear.  

## 2022-03-28 NOTE — Op Note (Signed)
[facility] ?Patient Name: Tom Mcknight ?Procedure Date: 03/28/2022 8:32 AM ?MRN: [id number] ?Endoscopist: Docia Chuck. Henrene Pastor , MD ?Age: 71 ?Referring MD:  ?Date of Birth: 02/19/1951 ?Gender: Male ?Account #: [id number] ?Procedure:                Colonoscopy with cold snare polypectomy x 1 ?Indications:              High risk colon cancer surveillance: Personal  ?                          history of adenoma (10 mm or greater in size), High  ?                          risk colon cancer surveillance: Personal history of  ?                          multiple (3 or more) adenomas. Previous  ?                          examinations 2003, 2004, 2012, 2018 ?Medicines:                Monitored Anesthesia Care ?Procedure:                Pre-Anesthesia Assessment: ?                          - Prior to the procedure, a History and Physical  ?                          was performed, and patient medications and  ?                          allergies were reviewed. The patient's tolerance of  ?                          previous anesthesia was also reviewed. The risks  ?                          and benefits of the procedure and the sedation  ?                          options and risks were discussed with the patient.  ?                          All questions were answered, and informed consent  ?                          was obtained. Prior Anticoagulants: The patient has  ?                          taken no previous anticoagulant or antiplatelet  ?                          agents. After reviewing the risks and benefits, the  ?  patient was deemed in satisfactory condition to  ?                          undergo the procedure. ?                          After obtaining informed consent, the colonoscope  ?                          was passed under direct vision. Throughout the  ?                          procedure, the patient's blood pressure, pulse, and  ?                          oxygen saturations  were monitored continuously. The  ?                          CF [id number] #[id number] was introduced through the anus  ?                          and advanced to the the cecum, identified by  ?                          appendiceal orifice and ileocecal valve. The  ?                          ileocecal valve, appendiceal orifice, and rectum  ?                          were photographed. The quality of the bowel  ?                          preparation was excellent. The colonoscopy was  ?                          performed without difficulty. The patient tolerated  ?                          the procedure well. The bowel preparation used was  ?                          SUPREP via split dose instruction. ?Scope In: 8:47:13 AM ?Scope Out: 9:04:33 AM ?Scope Withdrawal Time: 0 hours 13 minutes 41 seconds  ?Total Procedure Duration: 0 hours 17 minutes 20 seconds  ?Findings:                 A 3 mm polyp was found in the transverse colon. The  ?                          polyp was removed with a cold snare. Resection and  ?                          retrieval were complete. ?  Scattered medium-mouthed diverticula were found in  ?                          the entire colon but mostly concentrated. ?                          The exam was otherwise without abnormality on  ?                          direct and retroflexion views. ?Complications:            No immediate complications. Estimated blood loss:  ?                          None. ?Estimated Blood Loss:     Estimated blood loss: none. ?Impression:               - One 3 mm polyp in the transverse colon, removed  ?                          with a cold snare. Resected and retrieved. ?                          - Diverticulosis in the entire examined colon. ?                          - The examination was otherwise normal on direct  ?                          and retroflexion views. ?Recommendation:           - Repeat colonoscopy in 5 years for surveillance. ?                           - Patient has a contact number available for  ?                          emergencies. The signs and symptoms of potential  ?                          delayed complications were discussed with the  ?                          patient. Return to normal activities tomorrow.  ?                          Written discharge instructions were provided to the  ?                          patient. ?                          - Resume previous diet. ?                          - Continue present medications. ?                          -  Await pathology results. ?Docia Chuck. Henrene Pastor, MD ?03/28/2022 9:23:14 AM ?This report has been signed electronically. ?

## 2022-03-28 NOTE — Progress Notes (Signed)
Pt's states no medical or surgical changes since previsit or office visit. 

## 2022-03-28 NOTE — Progress Notes (Signed)
Called to room to assist during endoscopic procedure.  Patient ID and intended procedure confirmed with present staff. Received instructions for my participation in the procedure from the performing physician.  

## 2022-03-28 NOTE — Progress Notes (Signed)
HISTORY OF PRESENT ILLNESS: ? ?Tom Mcknight is a 71 y.o. male with history of multiple advanced adenomatous colon polyps.  Presents today for surveillance.  Last examination 2018 ? ?REVIEW OF SYSTEMS: ? ?All non-GI ROS negative. ?Past Medical History:  ?Diagnosis Date  ? Allergy   ? Arthritis   ? left knee, fingers  ? Asthma   ? as child- outgrew age 17  ? Cataract   ? forming  ? Dysplastic nevus 01/17/2020  ? R upper clavicle   ? Dysplastic nevus 01/24/2020  ? Spinal mid upper back   ? Dysplastic nevus 01/01/2021  ? R shoulder, severe atypia, excised 01/22/21  ? Dysplastic nevus 01/01/2021  ? R upper arm superior, severe atypia excised 01/29/21  ? Elevated PSA   ? Melanoma ([place]) 03/08/2019  ? 0.15m, Level II[facility]  ? ? ?Past Surgical History:  ?Procedure Laterality Date  ? COLONOSCOPY  last colon 07/29/11  ? NASAL POLYP EXCISION  [id number] ? POLYPECTOMY    ? prostate seed implants  06/2021  ? TOTAL HIP ARTHROPLASTY Right 2009  ? WRIST SURGERY Left 2022  ? ? ?Social History ?[name] reports that he has never smoked. He has never used smokeless tobacco. He reports that he does not drink alcohol and does not use drugs. ? ?family history is not on file. ? ?Allergies  ?Allergen Reactions  ? Septra [Sulfamethoxazole-Trimethoprim] Nausea Only  ? Sulfa Antibiotics Nausea And Vomiting  ? ? ?  ? ?PHYSICAL EXAMINATION: ? ?Vital signs: BP (!) 145/85   Pulse 73   Temp (!) 97.5 ?F (36.4 ?C)   Resp 13   Ht '6\' 1"'$  (1.854 m)   Wt 210 lb (95.3 kg)   SpO2 97%   BMI 27.71 kg/m?  ?General: Well-developed, well-nourished, no acute distress ?HEENT: Sclerae are anicteric, conjunctiva pink. Oral mucosa intact ?Lungs: Clear ?Heart: Regular ?Abdomen: soft, nontender, nondistended, no obvious ascites, no peritoneal signs, normal bowel sounds. No organomegaly. ?Extremities: No edema ?Psychiatric: alert and oriented x3. Cooperative  ? ? ? ?ASSESSMENT: ? ?Multiple and advanced adenomatous colon  polyps ? ? ?PLAN: ? ?Surveillance colonoscopy ? ? ? ? ?  ?

## 2022-03-28 NOTE — Patient Instructions (Signed)
Resume previous diet and medications. Awaiting pathology results. Repeat Colonoscopy in 5 years for surveillance. ? ?YOU HAD AN ENDOSCOPIC PROCEDURE TODAY AT [facility]:   Refer to the procedure report that was given to you for any specific questions about what was found during the examination.  If the procedure report does not answer your questions, please call your gastroenterologist to clarify.  If you requested that your care partner not be given the details of your procedure findings, then the procedure report has been included in a sealed envelope for you to review at your convenience later. ? ?YOU SHOULD EXPECT: Some feelings of bloating in the abdomen. Passage of more gas than usual.  Walking can help get rid of the air that was put into your GI tract during the procedure and reduce the bloating. If you had a lower endoscopy (such as a colonoscopy or flexible sigmoidoscopy) you may notice spotting of blood in your stool or on the toilet paper. If you underwent a bowel prep for your procedure, you may not have a normal bowel movement for a few days. ? ?Please Note:  You might notice some irritation and congestion in your nose or some drainage.  This is from the oxygen used during your procedure.  There is no need for concern and it should clear up in a day or so. ? ?SYMPTOMS TO REPORT IMMEDIATELY: ? ?Following lower endoscopy (colonoscopy or flexible sigmoidoscopy): ? Excessive amounts of blood in the stool ? Significant tenderness or worsening of abdominal pains ? Swelling of the abdomen that is new, acute ? Fever of 100?F or higher ? ?For urgent or emergent issues, a gastroenterologist can be reached at any hour by calling [phone]. ?Do not use MyChart messaging for urgent concerns.  ? ? ?DIET:  We do recommend a small meal at first, but then you may proceed to your regular diet.  Drink plenty of fluids but you should avoid alcoholic beverages for 24 hours. ? ?ACTIVITY:  You should  plan to take it easy for the rest of today and you should NOT DRIVE or use heavy machinery until tomorrow (because of the sedation medicines used during the test).   ? ?FOLLOW UP: ?Our staff will call the number listed on your records 48-72 hours following your procedure to check on you and address any questions or concerns that you may have regarding the information given to you following your procedure. If we do not reach you, we will leave a message.  We will attempt to reach you two times.  During this call, we will ask if you have developed any symptoms of COVID 19. If you develop any symptoms (ie: fever, flu-like symptoms, shortness of breath, cough etc.) before then, please call [phone].  If you test positive for Covid 19 in the 2 weeks post procedure, please call and report this information to [place].   ? ?If any biopsies were taken you will be contacted by phone or by letter within the next 1-3 weeks.  Please call us at [phone] if you have not heard about the biopsies in 3 weeks.  ? ? ?SIGNATURES/CONFIDENTIALITY: ?You and/or your care partner have signed paperwork which will be entered into your electronic medical record.  These signatures attest to the fact that that the information above on your After Visit Summary has been reviewed and is understood.  Full responsibility of the confidentiality of this discharge information lies with you and/or your care-partner.  ?

## 2022-04-01 ENCOUNTER — Telehealth: Payer: Self-pay

## 2022-04-01 NOTE — Telephone Encounter (Signed)
?  Follow up Call- ? ? ?  03/28/2022  ?  7:20 AM  ?Call back number  ?Post procedure Call Back phone  # [phone]  ?Permission to leave phone message Yes  ?  ? ?Patient questions: ? ?Do you have a fever, pain , or abdominal swelling? No. ?Pain Score  0 * ? ?Have you tolerated food without any problems? Yes.   ? ?Have you been able to return to your normal activities? Yes.   ? ?Do you have any questions about your discharge instructions: ?Diet   No. ?Medications  No. ?Follow up visit  No. ? ?Do you have questions or concerns about your Care? No. ? ?Actions: ?* If pain score is 4 or above: ?No action needed, pain <4. ? ? ? ? ?

## 2022-04-02 ENCOUNTER — Encounter: Payer: Self-pay | Admitting: Internal Medicine

## 2022-07-30 ENCOUNTER — Ambulatory Visit: Payer: Medicare HMO | Admitting: Dermatology

## 2022-07-30 DIAGNOSIS — L720 Epidermal cyst: Secondary | ICD-10-CM

## 2022-07-30 DIAGNOSIS — L578 Other skin changes due to chronic exposure to nonionizing radiation: Secondary | ICD-10-CM

## 2022-07-30 DIAGNOSIS — D229 Melanocytic nevi, unspecified: Secondary | ICD-10-CM

## 2022-07-30 DIAGNOSIS — Z1283 Encounter for screening for malignant neoplasm of skin: Secondary | ICD-10-CM

## 2022-07-30 DIAGNOSIS — Z86018 Personal history of other benign neoplasm: Secondary | ICD-10-CM

## 2022-07-30 DIAGNOSIS — D1800 Hemangioma unspecified site: Secondary | ICD-10-CM

## 2022-07-30 DIAGNOSIS — L820 Inflamed seborrheic keratosis: Secondary | ICD-10-CM | POA: Diagnosis not present

## 2022-07-30 DIAGNOSIS — L918 Other hypertrophic disorders of the skin: Secondary | ICD-10-CM

## 2022-07-30 DIAGNOSIS — D225 Melanocytic nevi of trunk: Secondary | ICD-10-CM

## 2022-07-30 DIAGNOSIS — Z85820 Personal history of malignant melanoma of skin: Secondary | ICD-10-CM

## 2022-07-30 DIAGNOSIS — L700 Acne vulgaris: Secondary | ICD-10-CM

## 2022-07-30 DIAGNOSIS — L821 Other seborrheic keratosis: Secondary | ICD-10-CM

## 2022-07-30 DIAGNOSIS — L814 Other melanin hyperpigmentation: Secondary | ICD-10-CM

## 2022-07-30 NOTE — Progress Notes (Signed)
Follow-Up Visit   Subjective  Tom Mcknight is a 71 y.o. male who presents for the following: Annual Exam (6 mth tbse hx of melanoma, hx of dysplastic, left side scalp near ear daughter noticed a spot 2 weeks ago. ).  The patient presents for Upper Body Skin Exam (UBSE) for skin cancer screening and mole check.  The patient has spots, moles and lesions to be evaluated, some may be new or changing and the patient has concerns that these could be cancer.  The following portions of the chart were reviewed this encounter and updated as appropriate:      Review of Systems: No other skin or systemic complaints except as noted in HPI or Assessment and Plan.   Objective  Well appearing patient in no apparent distress; mood and affect are within normal limits.  All skin waist up examined.  b/l medial canthus firm white papule at left medial canthus, decrease in open/closed comedones at temples  Right Lower Back 4.0 mm irregular medium brown macule slightly waxy   spinal mid back Subcutaneous nodule.   right earlobe x 1, left sternum x 1 (2) Erythematous stuck-on, waxy papule  b/l axilla x 9 (9) Fleshy, skin-colored pedunculated papules.    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 Observe.    Discussed extraction, topical treatment with retinoid. Patient may schedule in the future.    Continue Effaclar 0.1% Gel - apply a small amount at night as tolerated to aas face  Nevus Right Lower Back  Vs sk  Benign-appearing.  Observation.  Call clinic for new or changing lesions.  Recommend daily use of broad spectrum spf 30+ sunscreen to sun-exposed areas.    Epidermal inclusion cyst spinal mid back  Benign-appearing. Exam most consistent with an epidermal inclusion cyst. Discussed that a cyst is a benign growth that can grow over time and sometimes get irritated or inflamed.  Recommend observation if it is not bothersome. Discussed option of surgical excision to remove it if it is growing, symptomatic, or other changes noted. Please call for new or changing lesions so they can be evaluated.    Inflamed seborrheic keratosis (2) right earlobe x 1, left sternum x 1  Symptomatic, irritating, patient would like treated.  Destruction of lesion - right earlobe x 1, left sternum x 1  Destruction method: cryotherapy   Informed consent: discussed and consent obtained   Lesion destroyed using liquid nitrogen: Yes   Region frozen until ice ball extended beyond lesion: Yes   Outcome: patient tolerated procedure well with no complications   Post-procedure details: wound care instructions given   Additional details:  Prior to procedure, discussed risks of blister formation, small wound, skin dyspigmentation, or rare scar following cryotherapy. Recommend Vaseline ointment to treated areas while healing.   Acrochordon (9) b/l axilla x 9  Irritated and bothering patient   Procedure: Skin tag removal Informed consent:  Discussed risks (permanent scarring, infection, pain, bleeding, bruising, redness, and recurrence of the lesion) and benefits of the procedure, as well as the alternatives.  Patient is aware that skin tags are benign lesions, and their removal is often not considered medically necessary.  Informed consent was obtained. The area was prepared with isopropyl alcohol. Anesthesia:  lidocaine 1% with epinephrine was injected to achieve good local anesthesia Snip removal was performed.   Antibiotic ointment and a sterile  dressing were applied.   The patient tolerated procedure well. The patient was instructed on post-op care.   Number of lesions removed:  9 total  5 at left axilla and 4 at the right axilla   Lentigo left supra auricular scalp  Benign-appearing.  Observation.  Call clinic for new or changing lesions.  Recommend daily use of broad spectrum spf  30+ sunscreen to sun-exposed areas.    Recheck on f/up  Patient's daughter will continue to monitor with haircuts, RTC if changes.    Lentigines - Scattered tan macules - Due to sun exposure - Benign-appearing, observe - Recommend daily broad spectrum sunscreen SPF 30+ to sun-exposed areas, reapply every 2 hours as needed. - Call for any changes   Seborrheic Keratoses - Stuck-on, waxy, tan-brown papules and/or plaques  - Benign-appearing - Discussed benign etiology and prognosis. - Observe - Call for any changes  Melanocytic Nevi - Tan-brown and/or pink-flesh-colored symmetric macules and papules - Benign appearing on exam today - Observation - Call clinic for new or changing moles - Recommend daily use of broad spectrum spf 30+ sunscreen to sun-exposed areas.   Hemangiomas - Red papules - Discussed benign nature - Observe - Call for any changes  Actinic Damage - Chronic condition, secondary to cumulative UV/sun exposure - diffuse scaly erythematous macules with underlying dyspigmentation - Recommend daily broad spectrum sunscreen SPF 30+ to sun-exposed areas, reapply every 2 hours as needed.  - Staying in the shade or wearing long sleeves, sun glasses (UVA+UVB protection) and wide brim hats (4-inch brim around the entire circumference of the hat) are also recommended for sun protection.  - Call for new or changing lesions.  History of Dysplastic Nevi Multiple locations see history  - No evidence of recurrence today - Recommend regular full body skin exams - Recommend daily broad spectrum sunscreen SPF 30+ to sun-exposed areas, reapply every 2 hours as needed.  - Call if any new or changing lesions are noted between office visits  History of Melanoma 0.34m. Level II, 02/2019 [facility] - No evidence of recurrence today vertex scalp 2020 - No lymphadenopathy - Recommend regular full body skin exams - Recommend daily broad spectrum sunscreen SPF 30+ to sun-exposed  areas, reapply every 2 hours as needed.  - Call if any new or changing lesions are noted between office visits  Skin cancer screening performed today. Return in about 6 months (around 01/28/2023) for ubse hx melanoma.  I, MRuthell Rummage CMA, am acting as scribe for TBrendolyn Patty MD.  Documentation: I have reviewed the above documentation for accuracy and completeness, and I agree with the above.  TBrendolyn PattyMD

## 2022-07-30 NOTE — Patient Instructions (Addendum)
Skin Tag Removal Wound Care Instructions  Sometimes bandages are not necessary.  If a bandage is used, leave the original bandage on for 24 hours if possible.  If the bandage becomes soaked or soiled before that time, it is OK to remove it and examine the wound.  A small amount of post-operative bleeding is normal.  If excessive bleeding occurs, remove the bandage, place gauze over the site and apply continuous pressure (no peeking) over the area for 20-30 minutes.  If this does not stop the bleeding, try again for 40 minutes.  If this does not work, please call our clinic as soon as possible (even if after hours).    Once a day, cleanse the wound with soap and water.  If a thick crust develops you may use a Q-tip dipped into dilute hydrogen peroxide (mix 1:1 with water) to dissolve it.  Hydrogen peroxide can slow the healing process, so use it only as needed.  After washing, apply Vaseline jelly or Polysporin ointment.  For best healing, the wound should be covered with a layer of ointment at all times.  This may mean re-applying the ointment several times a day.  For open wounds, continue until it has healed.    If you have any swelling, keep the area elevated.  Some redness, tenderness and white or yellow material in the wound is normal healing.  If the area becomes very sore and red, or develops a thick yellow-green material (pus), it may be infected; please notify us.    Wound healing continues for up to one year following surgery.  It is not unusual to experience pain in the scar from time to time during the interval.  If the pain becomes severe or the scar thickens, you should notify the office.  A slight amount of redness in a scar is expected for the first six months.  After six months, the redness subsides and the scar will soften and fade.  The color difference becomes less noticeable with time.  If there are any problems, return for a post-op surgery check at your earliest convenience.  It is  important to wear sunscreen daily with an SPF of 30 or higher over the treated sites and to wear sun-protective clothing.  Please call our office at [phone] for any questions or concerns.   Seborrheic Keratosis  What causes seborrheic keratoses? Seborrheic keratoses are harmless, common skin growths that first appear during adult life.  As time goes by, more growths appear.  Some people may develop a large number of them.  Seborrheic keratoses appear on both covered and uncovered body parts.  They are not caused by sunlight.  The tendency to develop seborrheic keratoses can be inherited.  They vary in color from skin-colored to gray, brown, or even black.  They can be either smooth or have a rough, warty surface.   Seborrheic keratoses are superficial and look as if they were stuck on the skin.  Under the microscope this type of keratosis looks like layers upon layers of skin.  That is why at times the top layer may seem to fall off, but the rest of the growth remains and re-grows.    Treatment Seborrheic keratoses do not need to be treated, but can easily be removed in the office.  Seborrheic keratoses often cause symptoms when they rub on clothing or jewelry.  Lesions can be in the way of shaving.  If they become inflamed, they can cause itching, soreness, or burning.  Removal of a seborrheic keratosis can be accomplished by freezing, burning, or surgery. If any spot bleeds, scabs, or grows rapidly, please return to have it checked, as these can be an indication of a skin cancer.  Cryotherapy Aftercare  Wash gently with soap and water everyday.   Apply Vaseline and Band-Aid daily until healed.    Melanoma ABCDEs  Melanoma is the most dangerous type of skin cancer, and is the leading cause of death from skin disease.  You are more likely to develop melanoma if you: Have light-colored skin, light-colored eyes, or red or blond hair Spend a lot of time in the sun Tan regularly, either  outdoors or in a tanning bed Have had blistering sunburns, especially during childhood Have a close family member who has had a melanoma Have atypical moles or large birthmarks  Early detection of melanoma is key since treatment is typically straightforward and cure rates are extremely high if we catch it early.   The first sign of melanoma is often a change in a mole or a new dark spot.  The ABCDE system is a way of remembering the signs of melanoma.  A for asymmetry:  The two halves do not match. B for border:  The edges of the growth are irregular. C for color:  A mixture of colors are present instead of an even brown color. D for diameter:  Melanomas are usually (but not always) greater than 42m - the size of a pencil eraser. E for evolution:  The spot keeps changing in size, shape, and color.  Please check your skin once per month between visits. You can use a small mirror in front and a large mirror behind you to keep an eye on the back side or your body.   If you see any new or changing lesions before your next follow-up, please call to schedule a visit.  Please continue daily skin protection including broad spectrum sunscreen SPF 30+ to sun-exposed areas, reapplying every 2 hours as needed when you're outdoors.   Staying in the shade or wearing long sleeves, sun glasses (UVA+UVB protection) and wide brim hats (4-inch brim around the entire circumference of the hat) are also recommended for sun protection.    Due to recent changes in healthcare laws, you may see results of your pathology and/or laboratory studies on MyChart before the doctors have had a chance to review them. We understand that in some cases there may be results that are confusing or concerning to you. Please understand that not all results are received at the same time and often the doctors may need to interpret multiple results in order to provide you with the best plan of care or course of treatment. Therefore, we  ask that you please give u[place]2 business days to thoroughly review all your results before contacting the office for clarification. Should we see a critical lab result, you will be contacted sooner.   If You Need Anything After Your Visit  If you have any questions or concerns for your doctor, please call our main line at [phone] press option 4 to reach your doctor's medical assistant. If no one answers, please leave a voicemail as directed and we will return your call as soon as possible. Messages left after 4 pm will be answered the following business day.   You may also send [place] message via [facility] We typically respond to MyChart messages within 1-2 business days.  For prescription refills, please ask your  pharmacy to contact our office. Our fax number is [phone].  If you have an urgent issue when the clinic is closed that cannot wait until the next business day, you can page your doctor at the number below.    Please note that while we do our best to be available for urgent issues outside of office hours, we are not available 24/7.   If you have an urgent issue and are unable to reach [place], you may choose to seek medical care at your doctor's office, retail clinic, urgent care center, or emergency room.  If you have a medical emergency, please immediately call 911 or go to the emergency department.  Pager Numbers  - Dr. Nehemiah Massed: [phone]  - Dr. Laurence Ferrari: [phone]  - Dr. Nicole Kindred: [phone]  In the event of inclement weather, please call our main line at [phone] for an update on the status of any delays or closures.  Dermatology Medication Tips: Please keep the boxes that topical medications come in in order to help keep track of the instructions about where and how to use these. Pharmacies typically print the medication instructions only on the boxes and not directly on the medication tubes.   If your medication is too expensive, please contact our office  at [phone] option 4 or send [place] a message through [place].   We are unable to tell what your co-pay for medications will be in advance as this is different depending on your insurance coverage. However, we may be able to find a substitute medication at lower cost or fill out paperwork to get insurance to cover a needed medication.   If a prior authorization is required to get your medication covered by your insurance company, please allow [place] 1-2 business days to complete this process.  Drug prices often vary depending on where the prescription is filled and some pharmacies may offer cheaper prices.  The website www.goodrx.com contains coupons for medications through different pharmacies. The prices here do not account for what the cost may be with help from insurance (it may be cheaper with your insurance), but the website can give you the price if you did not use any insurance.  - You can print the associated coupon and take it with your prescription to the pharmacy.  - You may also stop by our office during regular business hours and pick up a GoodRx coupon card.  - If you need your prescription sent electronically to a different pharmacy, notify our office through [facility] or by phone at [phone] option 4.     Si Usted Necesita Algo Despus de Su Visita  Tambin puede enviarnos un mensaje a travs de Pharmacist, community. Por lo general respondemos a los mensajes de MyChart en el transcurso de 1 a 2 das hbiles.  Para renovar recetas, por favor pida a su farmacia que se ponga en contacto con nuestra oficina. Harland Dingwall de fax es [place] [phone].  Si tiene un asunto urgente cuando la clnica est cerrada y que no puede esperar hasta el siguiente da hbil, puede llamar/localizar a su doctor(a) al nmero que aparece a continuacin.   Por favor, tenga en cuenta que aunque hacemos todo lo posible para estar disponibles para asuntos urgentes fuera del horario de [place], no estamos  disponibles las 24 horas del da, los 7 das de la [place].   Si tiene un problema urgente y no puede comunicarse con nosotros, puede optar por buscar atencin mdica  en el consultorio de su doctor(a), en  una clnica privada, en un centro de atencin urgente o en una sala de emergencias.  Si tiene Engineering geologist, por favor llame inmediatamente al 911 o vaya a la sala de emergencias.  Nmeros de bper  - Dr. Nehemiah Massed: [phone]  - Dra. Moye: [phone]  - Dra. Nicole Kindred: [phone]  En caso de inclemencias del [place], por favor llame a Johnsie Kindred principal al [phone] para una actualizacin sobre el [place] de cualquier retraso o cierre.  Consejos para la medicacin en dermatologa: Por favor, guarde las cajas en las que vienen los medicamentos de uso tpico para ayudarle a seguir las instrucciones sobre dnde y cmo usarlos. Las farmacias generalmente imprimen las instrucciones del medicamento slo en las cajas y no directamente en los tubos del [place].   Si su medicamento es muy caro, por favor, pngase en contacto con Zigmund Daniel llamando al [phone] y presione la opcin 4 o envenos un mensaje a travs de Pharmacist, community.   No podemos decirle cul ser su copago por los medicamentos por adelantado ya que esto es diferente dependiendo de la cobertura de su seguro. Sin embargo, es posible que podamos encontrar un medicamento sustituto a Electrical engineer un formulario para que el seguro cubra el medicamento que se considera necesario.   Si se requiere una autorizacin previa para que su compaa de seguros [place] su medicamento, por favor permtanos de 1 a 2 das hbiles para completar este proceso.  Los precios de los medicamentos varan con frecuencia dependiendo del Environmental consultant de dnde se surte la receta y alguna farmacias pueden ofrecer precios ms baratos.  El sitio web www.goodrx.com tiene cupones para medicamentos de Airline pilot. Los precios aqu no  tienen en cuenta lo que podra costar con la ayuda del seguro (puede ser ms barato con su seguro), pero el sitio web puede darle el precio si no utiliz Research scientist (physical sciences).  - Puede imprimir el cupn correspondiente y llevarlo con su receta a la farmacia.  - Tambin puede pasar por nuestra oficina durante el horario de atencin regular y Charity fundraiser una tarjeta de cupones de GoodRx.  - Si necesita que su receta se enve electrnicamente a una farmacia diferente, informe a nuestra oficina a travs de MyChart de [place] o por telfono llamando al [phone] y presione la opcin 4.

## 2023-02-18 ENCOUNTER — Ambulatory Visit: Payer: Medicare HMO | Admitting: Dermatology

## 2023-02-18 ENCOUNTER — Encounter: Payer: Self-pay | Admitting: Dermatology

## 2023-02-18 VITALS — BP 120/79 | HR 74

## 2023-02-18 DIAGNOSIS — L720 Epidermal cyst: Secondary | ICD-10-CM

## 2023-02-18 DIAGNOSIS — D225 Melanocytic nevi of trunk: Secondary | ICD-10-CM

## 2023-02-18 DIAGNOSIS — Z85820 Personal history of malignant melanoma of skin: Secondary | ICD-10-CM

## 2023-02-18 DIAGNOSIS — D692 Other nonthrombocytopenic purpura: Secondary | ICD-10-CM

## 2023-02-18 DIAGNOSIS — L814 Other melanin hyperpigmentation: Secondary | ICD-10-CM

## 2023-02-18 DIAGNOSIS — Z86018 Personal history of other benign neoplasm: Secondary | ICD-10-CM

## 2023-02-18 DIAGNOSIS — L820 Inflamed seborrheic keratosis: Secondary | ICD-10-CM

## 2023-02-18 DIAGNOSIS — C44319 Basal cell carcinoma of skin of other parts of face: Secondary | ICD-10-CM | POA: Diagnosis not present

## 2023-02-18 DIAGNOSIS — Z1283 Encounter for screening for malignant neoplasm of skin: Secondary | ICD-10-CM | POA: Diagnosis not present

## 2023-02-18 DIAGNOSIS — L578 Other skin changes due to chronic exposure to nonionizing radiation: Secondary | ICD-10-CM

## 2023-02-18 DIAGNOSIS — L729 Follicular cyst of the skin and subcutaneous tissue, unspecified: Secondary | ICD-10-CM

## 2023-02-18 DIAGNOSIS — D485 Neoplasm of uncertain behavior of skin: Secondary | ICD-10-CM

## 2023-02-18 DIAGNOSIS — R21 Rash and other nonspecific skin eruption: Secondary | ICD-10-CM

## 2023-02-18 DIAGNOSIS — C4491 Basal cell carcinoma of skin, unspecified: Secondary | ICD-10-CM

## 2023-02-18 DIAGNOSIS — L738 Other specified follicular disorders: Secondary | ICD-10-CM

## 2023-02-18 DIAGNOSIS — D229 Melanocytic nevi, unspecified: Secondary | ICD-10-CM

## 2023-02-18 HISTORY — DX: Basal cell carcinoma of skin, unspecified: C44.91

## 2023-02-18 MED ORDER — CLOBETASOL PROPIONATE 0.05 % EX CREA: TOPICAL_CREAM | CUTANEOUS | 0 refills | Status: AC

## 2023-02-18 NOTE — Progress Notes (Signed)
Follow-Up Visit   Subjective  Tom Mcknight is a 72 y.o. male who presents for the following: Skin Cancer Screening and Upper Body Skin Exam  The patient presents for Upper Body Skin Exam (UBSE) for skin cancer screening and mole check. The patient has spots, moles and lesions to be evaluated, some may be new or changing. He has a new pink scaly patch on his right medial ankle x 3 months. He has tried neosporin and cortisone cream. Area has grown a little. A little itchy. 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     The following portions of the chart were reviewed this encounter and updated as appropriate: medications, allergies, medical history  Review of Systems:  No other skin or systemic complaints except as noted in HPI or Assessment and Plan.  Objective  Well appearing patient in no apparent distress; mood and affect are within normal limits.  All skin waist up examined. Relevant physical exam findings are noted in the Assessment and Plan.  Left Sideburn Area 4.0 mm pink flesh pearly papule        Assessment & Plan  HISTORY OF MELANOMA 0.56mm. Level II, 02/2019 [facility]  - No evidence of recurrence today vertex scalp  - No lymphadenopathy - Recommend regular full body skin exams - Recommend daily broad spectrum sunscreen SPF 30+ to sun-exposed areas, reapply every 2 hours as needed.  - Call if any new or changing lesions are noted between office visits  History of Dysplastic Nevi Multiple locations see history  - No evidence of recurrence today - Recommend regular full body skin exams - Recommend daily broad spectrum sunscreen SPF 30+ to sun-exposed areas, reapply every 2 hours as needed.  - Call if any new or changing lesions are noted between office visits  Melanocytic Nevus  Right lower back - 4.0 mm irregular medium brown macule, slightly waxy   Benign appearing on exam today, stable.  Recommend observation. Call clinic for new or changing moles. Recommend daily use of broad spectrum spf 30+ sunscreen to sun-exposed areas.   Lentigo - left supra auricular scalp - 1 cm tan macule, no change when compared to photo - Due to sun exposure - Benign-appearing, stable, observe - Recommend daily broad spectrum sunscreen SPF 30+ to sun-exposed areas, reapply every 2 hours as needed. - Call for any changes  EPIDERMAL INCLUSION CYST Exam: Subcutaneous nodule at spinal mid back  Benign-appearing. Exam most consistent with an epidermal inclusion cyst. Discussed that a cyst is a benign growth that can grow over time and sometimes get irritated or inflamed. Recommend observation if it is not bothersome. Discussed option of surgical excision to remove it if it is growing, symptomatic, or other changes noted. Please call for new or changing lesions so they can be evaluated.  Rash Exam: Pink scaly patch of the right medial ankle; pink scaly papule on the right anterior thigh.   KOH prep negative  Differential diagnosis:  Tinea vs Psoriasis vs Lichen Planus, Chronic and persistent condition with duration or expected duration over one year. Condition is bothersome/symptomatic for patient. Currently flared.   Treatment Plan: Start Econazole Nitrate Cream Apply to AA rash BID x 2 weeks. Pt has at home.  If no improvement after 2 weeks, switch to Clobetasol Cream BID x 2 weeks. Avoid face, groin, axilla.  Topical steroids (such as triamcinolone, fluocinolone, fluocinonide, mometasone, clobetasol, halobetasol, betamethasone, hydrocortisone)  can cause thinning and lightening of the skin if they are used for too long in the same area. Your physician has selected the right strength medicine for your problem and area affected on the body. Please use your medication only as directed by your physician to prevent side effects.    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 Observe.    Continue Effaclar 0.1% Gel - apply a small amount at night as tolerated to aas face   Neoplasm of uncertain behavior of skin Left Sideburn Area  Skin / nail biopsy Type of biopsy: tangential   Informed consent: discussed and consent obtained   Patient was prepped and draped in usual sterile fashion: Area prepped with alcohol. Anesthesia: the lesion was anesthetized in a standard fashion   Anesthetic:  1% lidocaine w/ epinephrine 1-100,000 buffered w/ 8.4% NaHCO3 Instrument used: flexible razor blade   Hemostasis achieved with: pressure, aluminum chloride and electrodesiccation   Outcome: patient tolerated procedure well    Destruction of lesion  Destruction method: electrodesiccation and curettage   Informed consent: discussed and consent obtained   Curettage performed in three different directions: Yes   Electrodesiccation performed over the curetted area: Yes   Final wound size (cm):  0.6 Hemostasis achieved with:  pressure, aluminum chloride and electrodesiccation Outcome: patient tolerated procedure well with no complications   Post-procedure details: wound care instructions given   Post-procedure details comment:  Ointment and bandage applied.  Specimen 1 - Surgical pathology Differential Diagnosis: Nevus r/o BCC Check Margins: No EDC today  Purpura - Chronic; persistent and recurrent.  Treatable, but not curable. - Violaceous macules and patches - Benign - Related to trauma, age, sun damage and/or use of blood thinners, chronic use of topical and/or oral steroids - Observe - Can use OTC arnica containing moisturizer such as Dermend Bruise Formula if desired - Call for worsening or other concerns  INFLAMED SEBORRHEIC KERATOSIS Exam: Erythematous keratotic or waxy stuck-on papule  Symptomatic, irritating, patient would like treated.  Benign-appearing.  Call clinic for  new or changing lesions.   Prior to procedure, discussed risks of blister formation, small wound, skin dyspigmentation, or rare scar following treatment. Recommend Vaseline ointment to treated areas while healing.  Destruction Procedure Note Destruction method: cryotherapy   Informed consent: discussed and consent obtained   Lesion destroyed using liquid nitrogen: Yes   Outcome: patient tolerated procedure well with no complications   Post-procedure details: wound care instructions given   Locations: right mid cheek # of Lesions Treated: 1  Lentigines, Seborrheic Keratoses, Hemangiomas - Benign normal skin lesions - Benign-appearing - Call for any changes  Sebaceous Hyperplasia - Small yellow papules with a central dell - Benign-appearing - Observe. Call for changes.  Melanocytic Nevi - Tan-brown and/or pink-flesh-colored symmetric macules and papules - Benign appearing on exam today - Observation - Call clinic for new or changing moles - Recommend daily use of broad spectrum spf 30+ sunscreen to sun-exposed areas.   Actinic Damage - Chronic condition, secondary to cumulative UV/sun exposure - diffuse scaly erythematous macules with underlying dyspigmentation - Recommend daily broad spectrum sunscreen SPF 30+ to sun-exposed areas, reapply every 2 hours as needed.  - Staying in the shade or wearing long sleeves, sun glasses (UVA+UVB protection) and wide brim hats (4-inch brim around the entire circumference of the hat) are also recommended for sun protection.  - Call for new  or changing lesions.  Skin cancer screening performed today.  Return in about 6 months (around 08/20/2023) for Hx melanoma, UBSE, Hx Dysplastic Nevus. RTC sooner if rash lower leg not improving with treatment.  IJamesetta Orleans, CMA, am acting as scribe for Brendolyn Patty, MD .   Documentation: I have reviewed the above documentation for accuracy and completeness, and I agree with the above.  Brendolyn Patty, MD

## 2023-02-18 NOTE — Patient Instructions (Addendum)
Start Econazole Nitrate Cream or (lamisil (terbinafine) cream OTC) - Apply to rash on leg twice daily for 2 weeks. If no improvement, switch to Clobetasol Cream twice a day for 2 weeks.  Topical steroids (such as triamcinolone, fluocinolone, fluocinonide, mometasone, clobetasol, halobetasol, betamethasone, hydrocortisone) can cause thinning and lightening of the skin if they are used for too long in the same area. Your physician has selected the right strength medicine for your problem and area affected on the body. Please use your medication only as directed by your physician to prevent side effects.     Cryotherapy Aftercare  Wash gently with soap and water everyday.   Apply Vaseline and Band-Aid daily until healed.   Wound Care Instructions  Cleanse wound gently with soap and water once a day then pat dry with clean gauze. Apply a thin coat of Petrolatum (petroleum jelly, "Vaseline") over the wound (unless you have an allergy to this). We recommend that you use a new, sterile tube of Vaseline. Do not pick or remove scabs. Do not remove the yellow or white "healing tissue" from the base of the wound.  Cover the wound with fresh, clean, nonstick gauze and secure with paper tape. You may use Band-Aids in place of gauze and tape if the wound is small enough, but would recommend trimming much of the tape off as there is often too much. Sometimes Band-Aids can irritate the skin.  You should call the office for your biopsy report after 1 week if you have not already been contacted.  If you experience any problems, such as abnormal amounts of bleeding, swelling, significant bruising, significant pain, or evidence of infection, please call the office immediately.  FOR ADULT SURGERY PATIENTS: If you need something for pain relief you may take 1 extra strength Tylenol (acetaminophen) AND 2 Ibuprofen (200mg  each) together every 4 hours as needed for pain. (do not take these if you are allergic to them or  if you have a reason you should not take them.) Typically, you may only need pain medication for 1 to 3 days.     Due to recent changes in healthcare laws, you may see results of your pathology and/or laboratory studies on MyChart before the doctors have had a chance to review them. We understand that in some cases there may be results that are confusing or concerning to you. Please understand that not all results are received at the same time and often the doctors may need to interpret multiple results in order to provide you with the best plan of care or course of treatment. Therefore, we ask that you please give [place] 2 business days to thoroughly review all your results before contacting the office for clarification. Should we see a critical lab result, you will be contacted sooner.   If You Need Anything After Your Visit  If you have any questions or concerns for your doctor, please call our main line at [phone] and press option 4 to reach your doctor's medical assistant. If no one answers, please leave a voicemail as directed and we will return your call as soon as possible. Messages left after 4 pm will be answered the following business day.   You may also send [place] a message via [place]. We typically respond to MyChart messages within 1-2 business days.  For prescription refills, please ask your pharmacy to contact our office. Our fax number is [phone].  If you have an urgent issue when the clinic is closed that cannot wait  until the next business day, you can page your doctor at the number below.    Please note that while we do our best to be available for urgent issues outside of office hours, we are not available 24/7.   If you have an urgent issue and are unable to reach [place], you may choose to seek medical care at your doctor's office, retail clinic, urgent care center, or emergency room.  If you have a medical emergency, please immediately call 911 or go to the emergency  department.  Pager Numbers  - Dr. Nehemiah Massed: [phone]  - Dr. Laurence Ferrari: [phone]  - Dr. Nicole Kindred: [phone]  In the event of inclement weather, please call our main line at [phone] for an update on the status of any delays or closures.  Dermatology Medication Tips: Please keep the boxes that topical medications come in in order to help keep track of the instructions about where and how to use these. Pharmacies typically print the medication instructions only on the boxes and not directly on the medication tubes.   If your medication is too expensive, please contact our office at [phone] option 4 or send [place] a message through [place].   We are unable to tell what your co-pay for medications will be in advance as this is different depending on your insurance coverage. However, we may be able to find a substitute medication at lower cost or fill out paperwork to get insurance to cover a needed medication.   If a prior authorization is required to get your medication covered by your insurance company, please allow [place] 1-2 business days to complete this process.  Drug prices often vary depending on where the prescription is filled and some pharmacies may offer cheaper prices.  The website www.goodrx.com contains coupons for medications through different pharmacies. The prices here do not account for what the cost may be with help from insurance (it may be cheaper with your insurance), but the website can give you the price if you did not use any insurance.  - You can print the associated coupon and take it with your prescription to the pharmacy.  - You may also stop by our office during regular business hours and pick up a GoodRx coupon card.  - If you need your prescription sent electronically to a different pharmacy, notify our office through [facility] or by phone at [phone] option 4.     Si Usted Necesita Algo Despus de Su Visita  Tambin puede enviarnos un  mensaje a travs de Pharmacist, community. Por lo general respondemos a los mensajes de MyChart en el transcurso de 1 a 2 das hbiles.  Para renovar recetas, por favor pida a su farmacia que se ponga en contacto con nuestra oficina. Harland Dingwall de fax es [place] [phone].  Si tiene un asunto urgente cuando la clnica est cerrada y que no puede esperar hasta el siguiente da hbil, puede llamar/localizar a su doctor(a) al nmero que aparece a continuacin.   Por favor, tenga en cuenta que aunque hacemos todo lo posible para estar disponibles para asuntos urgentes fuera del horario de [place], no estamos disponibles las 24 horas del da, los 7 das de la [place].   Si tiene un problema urgente y no puede comunicarse con nosotros, puede optar por buscar atencin mdica  en el consultorio de su doctor(a), en una clnica privada, en un centro de atencin urgente o en una sala de emergencias.  Si tiene [employer], por favor llame inmediatamente  al 911 o vaya a la sala de Multimedia programmer.  Nmeros de bper  - Dr. Nehemiah Massed: [phone]  - Dra. Moye: [phone]  - Dra. Nicole Kindred: [phone]  En caso de inclemencias del [place], por favor llame a Johnsie Kindred principal al [phone] para una actualizacin sobre el [place] de cualquier retraso o cierre.  Consejos para la medicacin en dermatologa: Por favor, guarde las cajas en las que vienen los medicamentos de uso tpico para ayudarle a seguir las instrucciones sobre dnde y cmo usarlos. Las farmacias generalmente imprimen las instrucciones del medicamento slo en las cajas y no directamente en los tubos del [place].   Si su medicamento es muy caro, por favor, pngase en contacto con Zigmund Daniel llamando al [phone] y presione la opcin 4 o envenos un mensaje a travs de Pharmacist, community.   No podemos decirle cul ser su copago por los medicamentos por adelantado ya que esto es diferente dependiendo de la cobertura de su seguro. Sin embargo,  es posible que podamos encontrar un medicamento sustituto a Electrical engineer un formulario para que el seguro cubra el medicamento que se considera necesario.   Si se requiere una autorizacin previa para que su compaa de seguros [place] su medicamento, por favor permtanos de 1 a 2 das hbiles para completar este proceso.  Los precios de los medicamentos varan con frecuencia dependiendo del Environmental consultant de dnde se surte la receta y alguna farmacias pueden ofrecer precios ms baratos.  El sitio web www.goodrx.com tiene cupones para medicamentos de Airline pilot. Los precios aqu no tienen en cuenta lo que podra costar con la ayuda del seguro (puede ser ms barato con su seguro), pero el sitio web puede darle el precio si no utiliz Research scientist (physical sciences).  - Puede imprimir el cupn correspondiente y llevarlo con su receta a la farmacia.  - Tambin puede pasar por nuestra oficina durante el horario de atencin regular y Charity fundraiser una tarjeta de cupones de GoodRx.  - Si necesita que su receta se enve electrnicamente a una farmacia diferente, informe a nuestra oficina a travs de MyChart de [place] o por telfono llamando al [phone] y presione la opcin 4.

## 2023-02-26 ENCOUNTER — Telehealth: Payer: Self-pay

## 2023-02-26 NOTE — Telephone Encounter (Signed)
-----   Message from [name], MD sent at 02/26/2023 12:05 PM EDT ----- Skin , left sideburn area BASAL CELL CARCINOMA, NODULAR PATTERN  BCC skin cancer- already treated with EDC at time of biopsy    - please call patient

## 2023-02-26 NOTE — Telephone Encounter (Signed)
Advised pt of bx result/sh ?

## 2023-07-18 ENCOUNTER — Other Ambulatory Visit: Payer: Self-pay | Admitting: Urology

## 2023-07-18 DIAGNOSIS — R972 Elevated prostate specific antigen [PSA]: Secondary | ICD-10-CM

## 2023-07-30 ENCOUNTER — Ambulatory Visit
Admission: RE | Admit: 2023-07-30 | Discharge: 2023-07-30 | Disposition: A | Discharge status: Home / Self Care | Payer: Medicare HMO | Source: Ambulatory Visit | Attending: Urology | Admitting: Urology

## 2023-07-30 DIAGNOSIS — R972 Elevated prostate specific antigen [PSA]: Secondary | ICD-10-CM | POA: Diagnosis present

## 2023-07-30 MED ORDER — GADOBUTROL 1 MMOL/ML IV SOLN
10.0000 mL | Freq: Once | INTRAVENOUS | Status: AC | PRN
  Administered 2023-07-30: 10 mL via INTRAVENOUS

## 2023-09-30 ENCOUNTER — Ambulatory Visit: Payer: Medicare HMO | Admitting: Dermatology

## 2023-09-30 ENCOUNTER — Encounter: Payer: Self-pay | Admitting: Dermatology

## 2023-09-30 DIAGNOSIS — D692 Other nonthrombocytopenic purpura: Secondary | ICD-10-CM

## 2023-09-30 DIAGNOSIS — L578 Other skin changes due to chronic exposure to nonionizing radiation: Secondary | ICD-10-CM

## 2023-09-30 DIAGNOSIS — Z85828 Personal history of other malignant neoplasm of skin: Secondary | ICD-10-CM

## 2023-09-30 DIAGNOSIS — L814 Other melanin hyperpigmentation: Secondary | ICD-10-CM

## 2023-09-30 DIAGNOSIS — L738 Other specified follicular disorders: Secondary | ICD-10-CM

## 2023-09-30 DIAGNOSIS — W908XXA Exposure to other nonionizing radiation, initial encounter: Secondary | ICD-10-CM

## 2023-09-30 DIAGNOSIS — D225 Melanocytic nevi of trunk: Secondary | ICD-10-CM

## 2023-09-30 DIAGNOSIS — Z1283 Encounter for screening for malignant neoplasm of skin: Secondary | ICD-10-CM | POA: Diagnosis not present

## 2023-09-30 DIAGNOSIS — Z86018 Personal history of other benign neoplasm: Secondary | ICD-10-CM

## 2023-09-30 DIAGNOSIS — L821 Other seborrheic keratosis: Secondary | ICD-10-CM

## 2023-09-30 DIAGNOSIS — L720 Epidermal cyst: Secondary | ICD-10-CM

## 2023-09-30 DIAGNOSIS — D229 Melanocytic nevi, unspecified: Secondary | ICD-10-CM

## 2023-09-30 DIAGNOSIS — L988 Other specified disorders of the skin and subcutaneous tissue: Secondary | ICD-10-CM

## 2023-09-30 DIAGNOSIS — Z85820 Personal history of malignant melanoma of skin: Secondary | ICD-10-CM

## 2023-09-30 DIAGNOSIS — L729 Follicular cyst of the skin and subcutaneous tissue, unspecified: Secondary | ICD-10-CM

## 2023-09-30 NOTE — Patient Instructions (Signed)

## 2023-09-30 NOTE — Progress Notes (Signed)
Follow-Up Visit   Subjective  Tom Mcknight is a 72 y.o. male who presents for the following: Skin Cancer Screening and Upper Body Skin Exam. HxMM, HxBCC, Hx of dysplastic nevi.   The patient presents for Upper Body Skin Exam (UBSE) for skin cancer screening and mole check. The patient has spots, moles and lesions to be evaluated, some may be new or changing and the patient may have concern these could be cancer.  Hx of Lichen Planus, Currently in remission. Hands/arms clear today.   The following portions of the chart were reviewed this encounter and updated as appropriate: medications, allergies, medical history  Review of Systems:  No other skin or systemic complaints except as noted in HPI or Assessment and Plan.  Objective  Well appearing patient in no apparent distress; mood and affect are within normal limits.  All skin waist up examined. Relevant physical exam findings are noted in the Assessment and Plan.    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  - Call if any new or changing lesions are noted between office visits   History of Dysplastic Nevi Multiple locations see history  - No evidence of recurrence today - Recommend regular full body skin exams - Recommend daily broad spectrum sunscreen SPF 30+ to sun-exposed areas, reapply every 2 hours as needed.  - Call if any new or changing lesions are noted between office visits  HISTORY OF BASAL CELL CARCINOMA OF THE SKIN. Left sideburn area. EDC 02/18/2023. - No evidence of recurrence today - Recommend regular full body skin exams - Recommend daily broad spectrum sunscreen SPF 30+ to sun-exposed areas, reapply every 2 hours as needed.  - Call if any new or changing  lesions are noted between office visits    Melanocytic Nevus  Right lower back - 4.0 mm regular medium brown macule, slightly waxy   Benign appearing on exam today, stable. Recommend observation. Call clinic for new or changing moles. Recommend daily use of broad spectrum spf 30+ sunscreen to sun-exposed areas.    Lentigo - left supra auricular scalp - 1 cm tan macule, no change when compared to photo from 07/30/2022 - Due to sun exposure - Benign-appearing, stable, observe - Recommend daily broad spectrum sunscreen SPF 30+ to sun-exposed areas, reapply every 2 hours as needed. - Call for any changes    EPIDERMAL INCLUSION CYST Exam: Subcutaneous nodule at spinal mid back   Benign-appearing. Exam most consistent with an epidermal inclusion cyst. Discussed that a cyst is a benign growth that can grow over time and sometimes get irritated or inflamed. Recommend observation if it is not bothersome. Discussed option of surgical excision to remove it if it is growing, symptomatic, or other changes noted. Please call for new or changing lesions so they can be evaluated.   Skin cancer screening performed today.  Actinic Damage - Chronic condition, secondary to cumulative UV/sun exposure - diffuse scaly erythematous macules with underlying dyspigmentation - Recommend daily broad spectrum sunscreen SPF 30+ to sun-exposed areas, reapply every 2 hours as needed.  - Staying in the shade or wearing long sleeves, sun glasses (UVA+UVB protection) and wide brim hats (4-inch brim around the entire circumference of the hat) are also recommended for sun  protection.  - Call for new or changing lesions.  Lentigines, Seborrheic Keratoses, Hemangiomas - Benign normal skin lesions - Benign-appearing - Call for any changes  Melanocytic Nevi - Tan-brown and/or pink-flesh-colored symmetric macules and papules - Benign appearing on exam today - Observation - Call clinic for new or changing moles -  Recommend daily use of broad spectrum spf 30+ sunscreen to sun-exposed areas.   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 Condition is improving with treatment but not currently at goal.   Benign. Observe. Discussed extraction at L medial canthus since irritated- pt defers at this time. Continue Effaclar 0.1% Gel - apply a small amount at night as tolerated to aas face  Purpura - Chronic; persistent and recurrent.  Treatable, but not curable. - Violaceous macules and patches - Benign - Related to trauma, age, sun damage and/or use of blood thinners, chronic use of topical and/or oral steroids - Observe - Can use OTC arnica containing moisturizer such as Dermend Bruise Formula if desired - Call for worsening or other concerns  Sebaceous Hyperplasia - Small yellow papules with a central dell face - Benign-appearing - Observe. Call for changes.   Milia - Firm white papule/nodule at left medial canthus - type of cyst - benign - sometimes these will clear with nightly OTC adapalene/Differin 0.1% gel or retinol. - may be extracted if symptomatic, Patient deferred treatment at this time. - observe     Return in about 6 months (around 03/29/2024) for UBSE, HxMM, HxBCC, HxDN.  I, Lawson Radar, CMA, am acting as scribe for Tom Niece, MD.   Documentation: I have reviewed the above documentation for accuracy and completeness, and I agree with the above.  Tom Niece, MD

## 2024-04-13 ENCOUNTER — Ambulatory Visit: Payer: Medicare HMO | Admitting: Dermatology

## 2024-05-17 ENCOUNTER — Encounter: Payer: Self-pay | Admitting: Cardiology

## 2024-05-17 ENCOUNTER — Ambulatory Visit: Attending: Cardiology | Admitting: Cardiology

## 2024-05-17 VITALS — BP 130/62 | HR 68 | Ht 73.0 in | Wt 210.0 lb

## 2024-05-17 DIAGNOSIS — Z01810 Encounter for preprocedural cardiovascular examination: Secondary | ICD-10-CM | POA: Diagnosis not present

## 2024-05-17 DIAGNOSIS — Z01818 Encounter for other preprocedural examination: Secondary | ICD-10-CM

## 2024-05-17 NOTE — Patient Instructions (Signed)
 Medication Instructions:  Your physician recommends that you continue on your current medications as directed. Please refer to the Current Medication list given to you today.   *If you need a refill on your cardiac medications before your next appointment, please call your pharmacy*  Lab Work: No labs ordered today  If you have labs (blood work) drawn today and your tests are completely normal, you will receive your results only by: MyChart Message (if you have MyChart) OR A paper copy in the mail If you have any lab test that is abnormal or we need to change your treatment, we will call you to review the results.  Testing/Procedures: No test ordered today   Follow-Up: At [facility], you and your health needs are our priority.  As part of our continuing mission to provide you with exceptional heart care, our providers are all part of one team.  This team includes your primary Cardiologist (physician) and Advanced Practice Providers or APPs (Physician Assistants and Nurse Practitioners) who all work together to provide you with the care you need, when you need it.  Your next appointment:   Follow up as needed

## 2024-05-17 NOTE — Progress Notes (Signed)
 Cardiology Office Note:    Date:  05/17/2024   ID:  Tom Mcknight, DOB Dec 02, 1950, MRN [id number]  PCP:  Valora Agent, MD   [facility] Providers Cardiologist:  None     Referring MD: Valora Agent, MD   Chief Complaint  Patient presents with   Establish Care    Pre op pt has been doing well with no complaints of chest pain, chest pressure or SOB, medciation reviewed verbally with patient    History of Present Illness:    Tom Mcknight is a 73 y.o. male with a hx of prostate cancer, left knee osteoarthritis who presents for preop evaluation.  Patient has osteoarthritis of left knee causing significant pain with ambulation.  Left knee replacement being planned next month.  He denies chest pain or shortness of breath.  Denies any history of heart disease.  Underwent prostate biopsy about a month ago requiring anesthesia with no issues.  Heart left wrist surgery in 2022 with no issues.  Denies any family history of heart disease.  Past Medical History:  Diagnosis Date   Allergy    Arthritis    left knee, fingers   Asthma    as child- outgrew age 58   Basal cell carcinoma 02/18/2023   Left Sideburn Area, [facility]   Cataract    forming   Dysplastic nevus 01/17/2020   R upper clavicle    Dysplastic nevus 01/24/2020   Spinal mid upper back    Dysplastic nevus 01/01/2021   R shoulder, severe atypia, excised 01/22/21   Dysplastic nevus 01/01/2021   R upper arm superior, severe atypia excised 01/29/21   Elevated PSA    Melanoma (HCC) 03/08/2019   vertex scalp 0.49mm, Level II, treated at [facility] with [facility]    Past Surgical History:  Procedure Laterality Date   COLONOSCOPY  last colon 07/29/11   NASAL POLYP EXCISION  8002,8000   POLYPECTOMY     prostate seed implants  06/2021   TOTAL HIP ARTHROPLASTY Right 2009   WRIST SURGERY Left 2022    Current Medications: Current Meds  Medication Sig   aspirin EC 81 MG tablet Take 1 tablet by mouth daily.   B Complex  Vitamins (VITAMIN-B COMPLEX) TABS Take 1 tablet by mouth daily.   celecoxib (CELEBREX) 200 MG capsule Take 200 mg by mouth daily.   Cholecalciferol 25 MCG (1000 UT) capsule Vitamin D3 25 mcg (1,000 unit) capsule  Take by oral route.   clobetasol  cream (TEMOVATE ) 0.05 % Apply to affected areas rash twice daily x 2 weeks. Avoid face, groin, underarms.   diphenhydrAMINE HCl (ALLERGY MED PO) Take by mouth. [employer] brand Zyrtec Daily  spring and fall and PRN in the summer   fluticasone (FLONASE) 50 MCG/ACT nasal spray Place 2 sprays into the nose daily.   Multiple Vitamins-Minerals (ZINC PO) Take 1 tablet by mouth daily.   tamsulosin (FLOMAX) 0.4 MG CAPS capsule Take 0.4 mg by mouth daily.     Allergies:   Ketamine, Septra [sulfamethoxazole-trimethoprim], and Sulfa antibiotics   Social History   Socioeconomic History   Marital status: Widowed    Spouse name: Not on file   Number of children: Not on file   Years of education: Not on file   Highest education level: Not on file  Occupational History   Not on file  Tobacco Use   Smoking status: Never   Smokeless tobacco: Never  Vaping Use   Vaping status: Never Used  Substance and Sexual  Activity   Alcohol use: No   Drug use: No   Sexual activity: Not on file  Other Topics Concern   Not on file  Social History Narrative   Not on file   Social Drivers of Health   Financial Resource Strain: Patient Declined (08/27/2023)   Received from [facility]   Overall Financial Resource Strain (CARDIA)    Difficulty of Paying Living Expenses: Patient declined  Food Insecurity: Low Risk  (05/13/2024)   Received from [facility]   Hunger Vital Sign    Within the past 12 months, the food you bought just didn't last and you didn't have money to get more. : Never true    Within the past 12 months, you worried that your food would run out before you got money to buy more: Never true  Transportation Needs: No Transportation Needs  (05/13/2024)   Received from [facility]    In the past 12 months, has lack of reliable transportation kept you from medical appointments, meetings, work or from getting things needed for daily living? : No  Physical Activity: Sufficiently Active (03/22/2019)   Received from [facility]   Exercise Vital Sign    On average, how many days per week do you engage in moderate to strenuous exercise (like a brisk walk)?: 3 days    On average, how many minutes do you engage in exercise at this level?: 60 min  Stress: Not on file  Social Connections: Unknown (03/22/2019)   Received from [facility]   Social Connection and Isolation Panel    Frequency of Communication with Friends and Family: Not on file    Frequency of Social Gatherings with Friends and Family: Not on file    Attends Religious Services: Not on file    Active Member of Clubs or Organizations: Not on file    Attends Banker Meetings: Not on file    Are you married, widowed, divorced, separated, never married, or living with a partner?: Widowed     Family History: The patient's family history is negative for Colon cancer, Colon polyps, Esophageal cancer, Stomach cancer, and Rectal cancer.  ROS:   Please see the history of present illness.     All other systems reviewed and are negative.  EKGs/Labs/Other Studies Reviewed:    The following studies were reviewed today:  EKG Interpretation Date/Time:  Monday May 17 2024 08:17:39 EDT Ventricular Rate:  68 PR Interval:  174 QRS Duration:  90 QT Interval:  394 QTC Calculation: 418 R Axis:   -40  Text Interpretation: Normal sinus rhythm Left axis deviation Inferior infarct , age undetermined Confirmed by Darliss Rogue ([id number]) on 05/17/2024 8:32:29 AM    Recent Labs: No results found for requested labs within last 365 days.  Recent Lipid Panel No results found for: CHOL, TRIG, HDL, CHOLHDL, VLDL, LDLCALC,  LDLDIRECT   Risk Assessment/Calculations:             Physical Exam:    VS:  BP 130/62 (BP Location: Left Arm, Patient Position: Sitting)   Pulse 68   Ht 6' 1 (1.854 m)   Wt 210 lb (95.3 kg)   SpO2 96%   BMI 27.71 kg/m     Wt Readings from Last 3 Encounters:  05/17/24 210 lb (95.3 kg)  03/28/22 210 lb (95.3 kg)  03/07/22 210 lb (95.3 kg)     GEN:  Well nourished, well developed in no acute distress  HEENT: Normal NECK: No JVD; No carotid bruits CARDIAC: RRR, no murmurs, rubs, gallops RESPIRATORY:  Clear to auscultation without rales, wheezing or rhonchi  ABDOMEN: Soft, non-tender, non-distended MUSCULOSKELETAL:  No edema; No deformity  SKIN: Warm and dry NEUROLOGIC:  Alert and oriented x 3 PSYCHIATRIC:  Normal affect   ASSESSMENT:    1. Pre-op evaluation    PLAN:    In order of problems listed above:  Preop evaluation, left knee surgery being planned due to osteoarthritis.  Patient is clinically asymptomatic from a cardiac perspective.  Ambulation limited by left knee pain.  Has had procedures requiring anesthesia as recent as last month with no issues.  Okay to proceed with surgery from a cardiac perspective.  Cardiac testing not indicated at this time.  Follow-up as needed.      Medication Adjustments/Labs and Tests Ordered: Current medicines are reviewed at length with the patient today.  Concerns regarding medicines are outlined above.  Orders Placed This Encounter  Procedures   EKG 12-Lead   No orders of the defined types were placed in this encounter.   Patient Instructions  Medication Instructions:  Your physician recommends that you continue on your current medications as directed. Please refer to the Current Medication list given to you today.   *If you need a refill on your cardiac medications before your next appointment, please call your pharmacy*  Lab Work: No labs ordered today  If you have labs (blood work) drawn today and your tests  are completely normal, you will receive your results only by: MyChart Message (if you have MyChart) OR A paper copy in the mail If you have any lab test that is abnormal or we need to change your treatment, we will call you to review the results.  Testing/Procedures: No test ordered today   Follow-Up: At [facility], you and your health needs are our priority.  As part of our continuing mission to provide you with exceptional heart care, our providers are all part of one team.  This team includes your primary Cardiologist (physician) and Advanced Practice Providers or APPs (Physician Assistants and Nurse Practitioners) who all work together to provide you with the care you need, when you need it.  Your next appointment:   Follow up as needed          Signed, Redell Cave, MD  05/17/2024 1:26 PM    [facility]

## 2024-05-26 ENCOUNTER — Ambulatory Visit: Admitting: Dermatology

## 2024-05-26 DIAGNOSIS — Z1283 Encounter for screening for malignant neoplasm of skin: Secondary | ICD-10-CM

## 2024-05-26 DIAGNOSIS — S30860A Insect bite (nonvenomous) of lower back and pelvis, initial encounter: Secondary | ICD-10-CM

## 2024-05-26 DIAGNOSIS — C44311 Basal cell carcinoma of skin of nose: Secondary | ICD-10-CM | POA: Diagnosis not present

## 2024-05-26 DIAGNOSIS — L720 Epidermal cyst: Secondary | ICD-10-CM

## 2024-05-26 DIAGNOSIS — D1801 Hemangioma of skin and subcutaneous tissue: Secondary | ICD-10-CM

## 2024-05-26 DIAGNOSIS — Z86018 Personal history of other benign neoplasm: Secondary | ICD-10-CM

## 2024-05-26 DIAGNOSIS — L738 Other specified follicular disorders: Secondary | ICD-10-CM

## 2024-05-26 DIAGNOSIS — Z85820 Personal history of malignant melanoma of skin: Secondary | ICD-10-CM

## 2024-05-26 DIAGNOSIS — W908XXA Exposure to other nonionizing radiation, initial encounter: Secondary | ICD-10-CM

## 2024-05-26 DIAGNOSIS — L821 Other seborrheic keratosis: Secondary | ICD-10-CM

## 2024-05-26 DIAGNOSIS — L918 Other hypertrophic disorders of the skin: Secondary | ICD-10-CM

## 2024-05-26 DIAGNOSIS — W57XXXA Bitten or stung by nonvenomous insect and other nonvenomous arthropods, initial encounter: Secondary | ICD-10-CM

## 2024-05-26 DIAGNOSIS — D225 Melanocytic nevi of trunk: Secondary | ICD-10-CM

## 2024-05-26 DIAGNOSIS — D229 Melanocytic nevi, unspecified: Secondary | ICD-10-CM

## 2024-05-26 DIAGNOSIS — D692 Other nonthrombocytopenic purpura: Secondary | ICD-10-CM

## 2024-05-26 DIAGNOSIS — D492 Neoplasm of unspecified behavior of bone, soft tissue, and skin: Secondary | ICD-10-CM

## 2024-05-26 DIAGNOSIS — L578 Other skin changes due to chronic exposure to nonionizing radiation: Secondary | ICD-10-CM

## 2024-05-26 DIAGNOSIS — Z85828 Personal history of other malignant neoplasm of skin: Secondary | ICD-10-CM

## 2024-05-26 DIAGNOSIS — L729 Follicular cyst of the skin and subcutaneous tissue, unspecified: Secondary | ICD-10-CM

## 2024-05-26 DIAGNOSIS — L814 Other melanin hyperpigmentation: Secondary | ICD-10-CM

## 2024-05-26 NOTE — Progress Notes (Unsigned)
 Follow-Up Visit   Subjective  Tom Mcknight is a 73 y.o. male who presents for the following: Skin Cancer Screening and Upper Body Skin Exam. HxMM, HxBCC, Hx of dysplastic nevi.   The patient presents for Upper Body Skin Exam (UBSE) for skin cancer screening and mole check. The patient has spots, moles and lesions to be evaluated, some may be new or changing and the patient may have concern these could be cancer.   The following portions of the chart were reviewed this encounter and updated as appropriate: medications, allergies, medical history  Review of Systems:  No other skin or systemic complaints except as noted in HPI or Assessment and Plan.  Objective  Well appearing patient in no apparent distress; mood and affect are within normal limits.  All skin waist up examined. Relevant physical exam findings are noted in the Assessment and Plan.  Left Lower Back Pink edematous papules at L flank R lateral nasal ala 4 mm pearly flesh papule   Assessment & Plan   BUG BITE, INITIAL ENCOUNTER Left Lower Back Benign, observe.    NEOPLASM OF SKIN R lateral nasal ala Epidermal / dermal shaving  Lesion diameter (cm):  0.4 Informed consent: discussed and consent obtained   Patient was prepped and draped in usual sterile fashion: area prepped with alcohol. Anesthesia: the lesion was anesthetized in a standard fashion   Anesthetic:  1% lidocaine w/ epinephrine 1-100,000 buffered w/ 8.4% NaHCO3 Instrument used: flexible razor blade   Hemostasis achieved with: pressure, aluminum chloride and electrodesiccation   Outcome: patient tolerated procedure well    Destruction of lesion  Destruction method: electrodesiccation and curettage   Informed consent: discussed and consent obtained   Curettage performed in three different directions: Yes   Electrodesiccation performed over the curetted area: Yes   Final wound size (cm):  0.5 Hemostasis achieved with:  pressure, aluminum  chloride and electrodesiccation Outcome: patient tolerated procedure well with no complications   Post-procedure details: wound care instructions given   Additional details:  Mupirocin ointment and Bandaid applied   Specimen 1 - Surgical pathology Differential Diagnosis: R/o BCC  Check Margins: No 4 mm pearly flesh papule EDC today EDC today   Skin cancer screening performed today.  Actinic Damage - Chronic condition, secondary to cumulative UV/sun exposure - diffuse scaly erythematous macules with underlying dyspigmentation - Recommend daily broad spectrum sunscreen SPF 30+ to sun-exposed areas, reapply every 2 hours as needed.  - Staying in the shade or wearing long sleeves, sun glasses (UVA+UVB protection) and wide brim hats (4-inch brim around the entire circumference of the hat) are also recommended for sun protection.  - Call for new or changing lesions.  Lentigines, Seborrheic Keratoses, Hemangiomas - Benign normal skin lesions - Benign-appearing - Call for any changes  Melanocytic Nevi - Tan-brown and/or pink-flesh-colored symmetric macules and papules - 4 mm regular medium brown macule, slightly waxy at R lower back - Benign appearing on exam today - Observation - Call clinic for new or changing moles - Recommend daily use of broad spectrum spf 30+ sunscreen to sun-exposed areas.    Lentigo - left supra auricular scalp - 1 cm tan macule, no change when compared to photo from 07/30/2022 - Due to sun exposure - Benign-appearing, stable, observe - Recommend daily broad spectrum sunscreen SPF 30+ to sun-exposed areas, reapply every 2 hours as needed. - Call for any changes  HISTORY OF MELANOMA 0.71mm. Level II, 02/2019 WLE at [facility]  - No evidence  of recurrence today vertex scalp  - Recommend regular full body skin exams - Recommend daily broad spectrum sunscreen SPF 30+ to sun-exposed areas, reapply every 2 hours as needed.  - Call if any new or changing lesions are  noted between office visits   History of Dysplastic Nevi Multiple locations see history  - No evidence of recurrence today - Recommend regular full body skin exams - Recommend daily broad spectrum sunscreen SPF 30+ to sun-exposed areas, reapply every 2 hours as needed.  - Call if any new or changing lesions are noted between office visits   HISTORY OF BASAL CELL CARCINOMA OF THE SKIN. Left sideburn area. EDC 02/18/2023. - No evidence of recurrence today - Recommend regular full body skin exams - Recommend daily broad spectrum sunscreen SPF 30+ to sun-exposed areas, reapply every 2 hours as needed.  - Call if any new or changing lesions are noted between office visits    EPIDERMAL INCLUSION CYST Exam: Subcutaneous nodule at spinal mid back   Benign-appearing. Exam most consistent with an epidermal inclusion cyst. Discussed that a cyst is a benign growth that can grow over time and sometimes get irritated or inflamed. Recommend observation if it is not bothersome. Discussed option of surgical excision to remove it if it is growing, symptomatic, or other changes noted. Please call for new or changing lesions so they can be evaluated.  CYST Exam: Firm white papule at L medial canthus   Treatment Plan: Symptomatic, irritating, patient would like treated. Patient reports itching, will consider extraction at follow-up.  Pt prefers to wait due to upcoming knee surgery.    Purpura - Chronic; persistent and recurrent.  Treatable, but not curable. - Violaceous macules and patches - Benign - Related to trauma, age, sun damage and/or use of blood thinners, chronic use of topical and/or oral steroids - Observe - Can use OTC arnica containing moisturizer such as Dermend Bruise Formula if desired - Call for worsening or other concerns  Acrochordons (Skin Tags) - Fleshy, skin-colored pedunculated papules at neck - Benign appearing.  - Observe. - If desired, they can be removed with an in office  procedure that is not covered by insurance. - Please call the clinic if you notice any new or changing lesions.  Sebaceous Hyperplasia - Small yellow papules with a central dell at face - Benign-appearing - Observe. Call for changes.       Return in about 6 months (around 11/26/2024) for w/ Dr. Jackquline, HxMM, HxBCC, HxDysplastic Nevi, UBSE.  I, Jacquelynn V. Wilfred, CMA, am acting as scribe for Rexene Jackquline, MD .   Documentation: I have reviewed the above documentation for accuracy and completeness, and I agree with the above.  Rexene Jackquline, MD

## 2024-05-26 NOTE — Patient Instructions (Addendum)
 Electrodesiccation and Curettage ("Scrape and Burn") Wound Care Instructions  Leave the original bandage on for 24 hours if possible.  If the bandage becomes soaked or soiled before that time, it is OK to remove it and examine the wound.  A small amount of post-operative bleeding is normal.  If excessive bleeding occurs, remove the bandage, place gauze over the site and apply continuous pressure (no peeking) over the area for 30 minutes. If this does not work, please call our clinic as soon as possible or page your doctor if it is after hours.   Once a day, cleanse the wound with soap and water. It is fine to shower. If a thick crust develops you may use a Q-tip dipped into dilute hydrogen peroxide (mix 1:1 with water) to dissolve it.  Hydrogen peroxide can slow the healing process, so use it only as needed.    After washing, apply petroleum jelly (Vaseline) or an antibiotic ointment if your doctor prescribed one for you, followed by a bandage.    For best healing, the wound should be covered with a layer of ointment at all times. If you are not able to keep the area covered with a bandage to hold the ointment in place, this may mean re-applying the ointment several times a day.  Continue this wound care until the wound has healed and is no longer open. It may take several weeks for the wound to heal and close.  Itching and mild discomfort is normal during the healing process.  If you have any discomfort, you can take Tylenol (acetaminophen) or ibuprofen as directed on the bottle. (Please do not take these if you have an allergy to them or cannot take them for another reason).  Some redness, tenderness and white or yellow material in the wound is normal healing.  If the area becomes very sore and red, or develops a thick yellow-green material (pus), it may be infected; please notify us .    Wound healing continues for up to one year following surgery. It is not unusual to experience pain in the scar  from time to time during the interval.  If the pain becomes severe or the scar thickens, you should notify the office.    A slight amount of redness in a scar is expected for the first six months.  After six months, the redness will fade and the scar will soften and fade.  The color difference becomes less noticeable with time.  If there are any problems, return for a post-op surgery check at your earliest convenience.  To improve the appearance of the scar, you can use silicone scar gel, cream, or sheets (such as Mederma or Serica) every night for up to one year. These are available over the counter (without a prescription).  Please call our office at [phone] for any questions or concerns.   Recommend daily broad spectrum sunscreen SPF 30+ to sun-exposed areas, reapply every 2 hours as needed. Call for new or changing lesions.  Staying in the shade or wearing long sleeves, sun glasses (UVA+UVB protection) and wide brim hats (4-inch brim around the entire circumference of the hat) are also recommended for sun protection.   Melanoma ABCDEs  Melanoma is the most dangerous type of skin cancer, and is the leading cause of death from skin disease.  You are more likely to develop melanoma if you: Have light-colored skin, light-colored eyes, or red or blond hair Spend a lot of time in the sun Tan regularly, either  outdoors or in a tanning bed Have had blistering sunburns, especially during childhood Have a close family member who has had a melanoma Have atypical moles or large birthmarks  Early detection of melanoma is key since treatment is typically straightforward and cure rates are extremely high if we catch it early.   The first sign of melanoma is often a change in a mole or a new dark spot.  The ABCDE system is a way of remembering the signs of melanoma.  A for asymmetry:  The two halves do not match. B for border:  The edges of the growth are irregular. C for color:  A mixture of  colors are present instead of an even brown color. D for diameter:  Melanomas are usually (but not always) greater than 6mm - the size of a pencil eraser. E for evolution:  The spot keeps changing in size, shape, and color.  Please check your skin once per month between visits. You can use a small mirror in front and a large mirror behind you to keep an eye on the back side or your body.   If you see any new or changing lesions before your next follow-up, please call to schedule a visit.  Please continue daily skin protection including broad spectrum sunscreen SPF 30+ to sun-exposed areas, reapplying every 2 hours as needed when you're outdoors.   Staying in the shade or wearing long sleeves, sun glasses (UVA+UVB protection) and wide brim hats (4-inch brim around the entire circumference of the hat) are also recommended for sun protection.   Due to recent changes in healthcare laws, you may see results of your pathology and/or laboratory studies on MyChart before the doctors have had a chance to review them. We understand that in some cases there may be results that are confusing or concerning to you. Please understand that not all results are received at the same time and often the doctors may need to interpret multiple results in order to provide you with the best plan of care or course of treatment. Therefore, we ask that you please give us  2 business days to thoroughly review all your results before contacting the office for clarification. Should we see a critical lab result, you will be contacted sooner.   If You Need Anything After Your Visit  If you have any questions or concerns for your doctor, please call our main line at [phone] and press option 4 to reach your doctor's medical assistant. If no one answers, please leave a voicemail as directed and we will return your call as soon as possible. Messages left after 4 pm will be answered the following business day.   You may also send  us  a message via MyChart. We typically respond to MyChart messages within 1-2 business days.  For prescription refills, please ask your pharmacy to contact our office. Our fax number is [phone].  If you have an urgent issue when the clinic is closed that cannot wait until the next business day, you can page your doctor at the number below.    Please note that while we do our best to be available for urgent issues outside of office hours, we are not available 24/7.   If you have an urgent issue and are unable to reach us , you may choose to seek medical care at your doctor's office, retail clinic, urgent care center, or emergency room.  If you have a medical emergency, please immediately call 911 or go to the emergency  department.  Pager Numbers  - Dr. Hester: [phone]  - Dr. Jackquline: [phone]  - Dr. Claudene: [phone]   In the event of inclement weather, please call our main line at [phone] for an update on the status of any delays or closures.  Dermatology Medication Tips: Please keep the boxes that topical medications come in in order to help keep track of the instructions about where and how to use these. Pharmacies typically print the medication instructions only on the boxes and not directly on the medication tubes.   If your medication is too expensive, please contact our office at [phone] option 4 or send us  a message through MyChart.   We are unable to tell what your co-pay for medications will be in advance as this is different depending on your insurance coverage. However, we may be able to find a substitute medication at lower cost or fill out paperwork to get insurance to cover a needed medication.   If a prior authorization is required to get your medication covered by your insurance company, please allow us  1-2 business days to complete this process.  Drug prices often vary depending on where the prescription is filled and some pharmacies may  offer cheaper prices.  The website www.goodrx.com contains coupons for medications through different pharmacies. The prices here do not account for what the cost may be with help from insurance (it may be cheaper with your insurance), but the website can give you the price if you did not use any insurance.  - You can print the associated coupon and take it with your prescription to the pharmacy.  - You may also stop by our office during regular business hours and pick up a GoodRx coupon card.  - If you need your prescription sent electronically to a different pharmacy, notify our office through [facility] or by phone at [phone] option 4.     Si Usted Necesita Algo Despus de Su Visita  Tambin puede enviarnos un mensaje a travs de Clinical cytogeneticist. Por lo general respondemos a los mensajes de MyChart en el transcurso de 1 a 2 das hbiles.  Para renovar recetas, por favor pida a su farmacia que se ponga en contacto con nuestra oficina. Randi lakes de fax es [place] [phone].  Si tiene un asunto urgente cuando la clnica est cerrada y que no puede esperar hasta el siguiente da hbil, puede llamar/localizar a su doctor(a) al nmero que aparece a continuacin.   Por favor, tenga en cuenta que aunque hacemos todo lo posible para estar disponibles para asuntos urgentes fuera del horario de [place], no estamos disponibles las 24 horas del da, los 7 [address]  [address] de la [place].   Si tiene un problema urgente y no puede comunicarse con nosotros, puede optar por buscar atencin mdica  en el consultorio de su doctor(a), en una clnica privada, en un centro de atencin urgente o en una sala de emergencias.  Si tiene Engineer, drilling, por favor llame inmediatamente al 911 o vaya a la sala de emergencias.  Nmeros de bper  - Dr. Hester: [phone]  - Dra. Jackquline: [phone]  - Dr. Claudene: [phone]   En caso de inclemencias del tiempo, por favor llame a landry capes principal al  [phone] para una actualizacin sobre el [place] de cualquier retraso o cierre.  Consejos para la medicacin en dermatologa: Por favor, guarde las cajas en las que vienen los medicamentos de uso tpico para ayudarle a seguir las instrucciones sobre dnde y cmo usarlos.  Las farmacias generalmente imprimen las instrucciones del medicamento slo en las cajas y no directamente en los tubos del [place].   Si su medicamento es muy caro, por favor, pngase en contacto con landry rieger llamando al [phone] y presione la opcin 4 o envenos un mensaje a travs de Clinical cytogeneticist.   No podemos decirle cul ser su copago por los medicamentos por adelantado ya que esto es diferente dependiendo de la cobertura de su seguro. Sin embargo, es posible que podamos encontrar un medicamento sustituto a Audiological scientist un formulario para que el seguro cubra el medicamento que se considera necesario.   Si se requiere una autorizacin previa para que su compaa de seguros malta su medicamento, por favor permtanos de 1 a 2 das hbiles para completar este proceso.  Los precios de los medicamentos varan con frecuencia dependiendo del Environmental consultant de dnde se surte la receta y alguna farmacias pueden ofrecer precios ms baratos.  El sitio web www.goodrx.com tiene cupones para medicamentos de Health and safety inspector. Los precios aqu no tienen en cuenta lo que podra costar con la ayuda del seguro (puede ser ms barato con su seguro), pero el sitio web puede darle el precio si no utiliz Tourist information centre manager.  - Puede imprimir el cupn correspondiente y llevarlo con su receta a la farmacia.  - Tambin puede pasar por nuestra oficina durante el horario de atencin regular y Education officer, museum una tarjeta de cupones de GoodRx.  - Si necesita que su receta se enve electrnicamente a una farmacia diferente, informe a nuestra oficina a travs de MyChart de  o por telfono llamando al [phone] y presione la opcin 4.

## 2024-05-27 ENCOUNTER — Ambulatory Visit: Payer: Self-pay | Admitting: Dermatology

## 2024-05-27 LAB — SURGICAL PATHOLOGY

## 2024-05-31 ENCOUNTER — Encounter: Payer: Self-pay | Admitting: Dermatology

## 2024-05-31 NOTE — Telephone Encounter (Signed)
-----   Message from Rexene Rattler sent at 05/27/2024  6:31 PM EDT ----- 1. Skin, R lateral nasal ala :       BASAL CELL CARCINOMA, NODULAR PATTERN, BASE INVOLVED  BCC skin cancer- already treated with EDC at time of biopsy   - please call patient ----- Message ----- From: Interface, Lab In [place] Sent: 05/27/2024   6:13 PM EDT To: Rexene Rattler, MD

## 2024-05-31 NOTE — Telephone Encounter (Signed)
 Advised pt of bx results/sh ?

## 2024-11-30 ENCOUNTER — Ambulatory Visit: Admitting: Dermatology

## 2024-11-30 DIAGNOSIS — L720 Epidermal cyst: Secondary | ICD-10-CM

## 2024-11-30 DIAGNOSIS — L821 Other seborrheic keratosis: Secondary | ICD-10-CM | POA: Diagnosis not present

## 2024-11-30 DIAGNOSIS — L738 Other specified follicular disorders: Secondary | ICD-10-CM

## 2024-11-30 DIAGNOSIS — D1801 Hemangioma of skin and subcutaneous tissue: Secondary | ICD-10-CM

## 2024-11-30 DIAGNOSIS — Z85820 Personal history of malignant melanoma of skin: Secondary | ICD-10-CM

## 2024-11-30 DIAGNOSIS — L814 Other melanin hyperpigmentation: Secondary | ICD-10-CM

## 2024-11-30 DIAGNOSIS — L729 Follicular cyst of the skin and subcutaneous tissue, unspecified: Secondary | ICD-10-CM

## 2024-11-30 DIAGNOSIS — D225 Melanocytic nevi of trunk: Secondary | ICD-10-CM | POA: Diagnosis not present

## 2024-11-30 DIAGNOSIS — W908XXA Exposure to other nonionizing radiation, initial encounter: Secondary | ICD-10-CM

## 2024-11-30 DIAGNOSIS — D229 Melanocytic nevi, unspecified: Secondary | ICD-10-CM

## 2024-11-30 DIAGNOSIS — L820 Inflamed seborrheic keratosis: Secondary | ICD-10-CM | POA: Diagnosis not present

## 2024-11-30 DIAGNOSIS — Z1283 Encounter for screening for malignant neoplasm of skin: Secondary | ICD-10-CM

## 2024-11-30 DIAGNOSIS — L578 Other skin changes due to chronic exposure to nonionizing radiation: Secondary | ICD-10-CM | POA: Diagnosis not present

## 2024-11-30 DIAGNOSIS — L300 Nummular dermatitis: Secondary | ICD-10-CM | POA: Diagnosis not present

## 2024-11-30 DIAGNOSIS — Z85828 Personal history of other malignant neoplasm of skin: Secondary | ICD-10-CM | POA: Diagnosis not present

## 2024-11-30 DIAGNOSIS — Z86018 Personal history of other benign neoplasm: Secondary | ICD-10-CM

## 2024-11-30 NOTE — Patient Instructions (Addendum)
 Clobetasol  Cream - apply to rash on left knee twice a day x 2 weeks or until improved. Avoid applying to face, groin, and axilla. Use as directed. Long-term use can cause thinning of the skin.   Cryotherapy Aftercare  Wash gently with soap and water everyday.   Apply Vaseline and Band-Aid daily until healed.   Due to recent changes in healthcare laws, you may see results of your pathology and/or laboratory studies on MyChart before the doctors have had a chance to review them. We understand that in some cases there may be results that are confusing or concerning to you. Please understand that not all results are received at the same time and often the doctors may need to interpret multiple results in order to provide you with the best plan of care or course of treatment. Therefore, we ask that you please give us  2 business days to thoroughly review all your results before contacting the office for clarification. Should we see a critical lab result, you will be contacted sooner.   If You Need Anything After Your Visit  If you have any questions or concerns for your doctor, please call our main line at [phone] and press option 4 to reach your doctor's medical assistant. If no one answers, please leave a voicemail as directed and we will return your call as soon as possible. Messages left after 4 pm will be answered the following business day.   You may also send us  a message via MyChart. We typically respond to MyChart messages within 1-2 business days.  For prescription refills, please ask your pharmacy to contact our office. Our fax number is [phone].  If you have an urgent issue when the clinic is closed that cannot wait until the next business day, you can page your doctor at the number below.    Please note that while we do our best to be available for urgent issues outside of office hours, we are not available 24/7.   If you have an urgent issue and are unable to reach us , you  may choose to seek medical care at your doctor's office, retail clinic, urgent care center, or emergency room.  If you have a medical emergency, please immediately call 911 or go to the emergency department.  Pager Numbers  - Dr. Hester: [phone]  - Dr. Jackquline: [phone]  - Dr. Claudene: [phone]   - Dr. Raymund: [phone]  In the event of inclement weather, please call our main line at [phone] for an update on the status of any delays or closures.  Dermatology Medication Tips: Please keep the boxes that topical medications come in in order to help keep track of the instructions about where and how to use these. Pharmacies typically print the medication instructions only on the boxes and not directly on the medication tubes.   If your medication is too expensive, please contact our office at [phone] option 4 or send us  a message through MyChart.   We are unable to tell what your co-pay for medications will be in advance as this is different depending on your insurance coverage. However, we may be able to find a substitute medication at lower cost or fill out paperwork to get insurance to cover a needed medication.   If a prior authorization is required to get your medication covered by your insurance company, please allow us  1-2 business days to complete this process.  Drug prices often vary depending on where the prescription is filled and some pharmacies  may offer cheaper prices.  The website www.goodrx.com contains coupons for medications through different pharmacies. The prices here do not account for what the cost may be with help from insurance (it may be cheaper with your insurance), but the website can give you the price if you did not use any insurance.  - You can print the associated coupon and take it with your prescription to the pharmacy.  - You may also stop by our office during regular business hours and pick up a GoodRx coupon card.  - If you need  your prescription sent electronically to a different pharmacy, notify our office through [facility] or by phone at [phone] option 4.     Si Usted Necesita Algo Despus de Su Visita  Tambin puede enviarnos un mensaje a travs de Clinical Cytogeneticist. Por lo general respondemos a los mensajes de MyChart en el transcurso de 1 a 2 das hbiles.  Para renovar recetas, por favor pida a su farmacia que se ponga en contacto con nuestra oficina. Randi lakes de fax es [place] [phone].  Si tiene un asunto urgente cuando la clnica est cerrada y que no puede esperar hasta el siguiente da hbil, puede llamar/localizar a su doctor(a) al nmero que aparece a continuacin.   Por favor, tenga en cuenta que aunque hacemos todo lo posible para estar disponibles para asuntos urgentes fuera del horario de [place], no estamos disponibles las 24 horas del da, los 7 [address]  [address] de la [place].   Si tiene un problema urgente y no puede comunicarse con nosotros, puede optar por buscar atencin mdica  en el consultorio de su doctor(a), en una clnica privada, en un centro de atencin urgente o en una sala de emergencias.  Si tiene engineer, drilling, por favor llame inmediatamente al 911 o vaya a la sala de emergencias.  Nmeros de bper  - Dr. Hester: [phone]  - Dra. Jackquline: [phone]  - Dr. Claudene: [phone]  - Dra. Kitts: [phone]  En caso de inclemencias del [place], por favor llame a nuestra lnea principal al [phone] para una actualizacin sobre el [place] de cualquier retraso o cierre.  Consejos para la medicacin en dermatologa: Por favor, guarde las cajas en las que vienen los medicamentos de uso tpico para ayudarle a seguir las instrucciones sobre dnde y cmo usarlos. Las farmacias generalmente imprimen las instrucciones del medicamento slo en las cajas y no directamente en los tubos del [place].   Si su medicamento es muy caro, por favor, pngase en contacto con  landry rieger llamando al [phone] y presione la opcin 4 o envenos un mensaje a travs de Clinical Cytogeneticist.   No podemos decirle cul ser su copago por los medicamentos por adelantado ya que esto es diferente dependiendo de la cobertura de su seguro. Sin embargo, es posible que podamos encontrar un medicamento sustituto a audiological scientist un formulario para que el seguro cubra el medicamento que se considera necesario.   Si se requiere una autorizacin previa para que su compaa de seguros cubra su medicamento, por favor permtanos de 1 a 2 das hbiles para completar este proceso.  Los precios de los medicamentos varan con frecuencia dependiendo del environmental consultant de dnde se surte la receta y alguna farmacias pueden ofrecer precios ms baratos.  El sitio web www.goodrx.com tiene cupones para medicamentos de health and safety inspector. Los precios aqu no tienen en cuenta lo que podra costar con la ayuda del seguro (puede ser ms barato con su seguro), pero el sitio web puede  darle el precio si no utiliz [employer].  - Puede imprimir el cupn correspondiente y llevarlo con su receta a la farmacia.  - Tambin puede pasar por nuestra oficina durante el horario de atencin regular y education officer, museum una tarjeta de cupones de GoodRx.  - Si necesita que su receta se enve electrnicamente a una farmacia diferente, informe a nuestra oficina a travs de MyChart de [place] o por telfono llamando al [phone] y presione la opcin 4.

## 2024-11-30 NOTE — Progress Notes (Signed)
 "  Follow-Up Visit   Subjective  Tom Mcknight is a 74 y.o. male who presents for the following: 6 month follow-up Skin Cancer Screening and Upper Body Skin Exam. History of BCCs, R lateral nasal ala treated at last visit. History of MM, DN.   The patient presents for Upper Body Skin Exam (UBSE) for skin cancer screening and mole check. The patient has spots, moles and lesions to be evaluated, some may be new or changing. He has an irritated spot on the left knee near scar from knee replacement surgery. Irritated cyst at the left medial canthus to be extracted today. Itchy spots on the left lower neck.    The following portions of the chart were reviewed this encounter and updated as appropriate: medications, allergies, medical history  Review of Systems:  No other skin or systemic complaints except as noted in HPI or Assessment and Plan.  Objective  Well appearing patient in no apparent distress; mood and affect are within normal limits.  All skin waist up examined. Relevant physical exam findings are noted in the Assessment and Plan.  L lower neck x 4 Waxy pedunculated papules at left lower neck.  Left Medial Canthus 7 mm multiloculated subcutaneous nodule at left medial eye canthus.  Assessment & Plan  Skin cancer screening performed today.  Actinic Damage - Chronic condition, secondary to cumulative UV/sun exposure - diffuse scaly erythematous macules with underlying dyspigmentation - Recommend daily broad spectrum sunscreen SPF 30+ to sun-exposed areas, reapply every 2 hours as needed.  - Staying in the shade or wearing long sleeves, sun glasses (UVA+UVB protection) and wide brim hats (4-inch brim around the entire circumference of the hat) are also recommended for sun protection.  - Call for new or changing lesions.  Lentigines, Seborrheic Keratoses, Hemangiomas - Benign normal skin lesions - Benign-appearing - Call for any changes  Melanocytic Nevi - Tan-brown and/or  pink-flesh-colored symmetric macules and papules - 4 mm regular medium brown macule, slightly waxy at R lower back  - Benign appearing on exam today - Observation - Call clinic for new or changing moles - Recommend daily use of broad spectrum spf 30+ sunscreen to sun-exposed areas.   HISTORY OF MELANOMA 0.81mm. Level II, 02/2019 [facility] at [facility]. Vertex Scalp.  - No evidence of recurrence today  - Recommend regular full body skin exams - Recommend daily broad spectrum sunscreen SPF 30+ to sun-exposed areas, reapply every 2 hours as needed.  - Call if any new or changing lesions are noted between office visits   HISTORY OF BASAL CELL CARCINOMA OF THE SKIN.  Left sideburn area. [facility] 02/18/2023. Right lateral nasal ala, [facility] 05/26/2024. - No evidence of recurrence today - Recommend regular full body skin exams - Recommend daily broad spectrum sunscreen SPF 30+ to sun-exposed areas, reapply every 2 hours as needed.  - Call if any new or changing lesions are noted between office visits  History of Dysplastic Nevi Multiple locations see history  - No evidence of recurrence today - Recommend regular full body skin exams - Recommend daily broad spectrum sunscreen SPF 30+ to sun-exposed areas, reapply every 2 hours as needed.  - Call if any new or changing lesions are noted between office visits  Sebaceous Hyperplasia - Small yellow papules with a central dell - Benign-appearing - Observe. Call for changes.  EPIDERMAL INCLUSION CYST Exam: Subcutaneous nodule at mid back  Benign-appearing. Exam most consistent with an epidermal inclusion cyst. Discussed that a cyst is a benign  growth that can grow over time and sometimes get irritated or inflamed. Recommend observation if it is not bothersome. Discussed option of surgical excision to remove it if it is growing, symptomatic, or other changes noted. Please call for new or changing lesions so they can be evaluated.  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 Condition is bothersome/symptomatic for patient. Currently flared.   Nummular dermatitis (eczema) is a chronic, relapsing, itchy rash that can significantly affect quality of life. It is often associated with dry skin and flares in the wintertime, and may require treatment with prescription topical anti-inflammatory medications, in addition to gentle skin care.  If there is associated atopic dermatitis and topicals are not working, then biologic injections may be necessary to clear rash and control symptoms.  Treatment Plan: Start clobetasol  cream twice a day x 2 weeks. Pt has at home. Avoid applying to face, groin, and axilla. Use as directed. Long-term use can cause thinning of the skin.   Recommend mild soap and moisturizing cream 1-2 times daily.  Gentle skin care handout provided.    INFLAMED SEBORRHEIC KERATOSIS L lower neck x 4 Symptomatic, irritating, patient would like treated. - Destruction of lesion - L lower neck x 4  Destruction method: cryotherapy   Informed consent: discussed and consent obtained   Lesion destroyed using liquid nitrogen: Yes   Region frozen until ice ball extended beyond lesion: Yes   Outcome: patient tolerated procedure well with no complications   Post-procedure details: wound care instructions given   Additional details:  Prior to procedure, discussed risks of blister formation, small wound, skin dyspigmentation, or rare scar following cryotherapy. Recommend Vaseline ointment to treated areas while healing.   EPIDERMAL INCLUSION CYST Left Medial Canthus Benign-appearing. Exam most consistent with an epidermal inclusion cyst. Discussed that a cyst is a benign growth that can grow over time and sometimes get irritated or inflamed. Recommend observation if it is not bothersome. Discussed option of surgical excision to remove it if it is growing,  symptomatic, or other changes noted. Please call for new or changing lesions so they can be evaluated.  Symptomatic, irritating, patient would like it removed.    - Incision and Drainage - Left Medial Canthus Location: left medial canthus  Informed Consent: Discussed risks (permanent scarring, light or dark discoloration, infection, pain, bleeding, bruising, redness, damage to adjacent structures, and recurrence of the lesion) and benefits of the procedure, as well as the alternatives.  Informed consent was obtained.  Preparation: The area was prepped with alcohol.  Anesthesia: Lidocaine 1% with epinephrine  Procedure Details: Multiple incisions were made overlying the multiloculated lesion. The lesion drained white, chalky cyst material and blood.  A small amount of fluid was drained.    Antibiotic ointment and a sterile pressure dressing were applied. The patient tolerated procedure well.  Total number of lesions drained: 1  Plan: The patient was instructed on post-op care. Recommend OTC analgesia as needed for pain.   SKIN CANCER SCREENING   ACTINIC SKIN DAMAGE   LENTIGO   SEBORRHEIC KERATOSIS   HEMANGIOMA OF SKIN   NEVUS   HISTORY OF MELANOMA   HISTORY OF BASAL CELL CARCINOMA (BCC) OF SKIN   HISTORY OF DYSPLASTIC NEVUS   SEBACEOUS HYPERPLASIA   CYST OF SKIN   NUMMULAR DERMATITIS    Return in about 6 months (around 05/30/2025) for Hx melanoma, Hx BCC, UBSE.  I,  Andrea Kerns, CMA, am acting as scribe for Rexene Rattler, MD .   Documentation: I have reviewed the above documentation for accuracy and completeness, and I agree with the above.  Rexene Rattler, MD    "

## 2025-06-07 ENCOUNTER — Ambulatory Visit: Admitting: Dermatology
